# Patient Record
Sex: Female | Born: 1965 | Hispanic: No | Marital: Married | Smoking: Never smoker
Health system: Southern US, Community
[De-identification: ages and names within clinical notes are randomized; demographics above are authoritative.]

## PROBLEM LIST (undated history)

## (undated) DIAGNOSIS — I10 Essential (primary) hypertension: Secondary | ICD-10-CM

## (undated) DIAGNOSIS — E119 Type 2 diabetes mellitus without complications: Secondary | ICD-10-CM

## (undated) DIAGNOSIS — G629 Polyneuropathy, unspecified: Secondary | ICD-10-CM

## (undated) DIAGNOSIS — E789 Disorder of lipoprotein metabolism, unspecified: Secondary | ICD-10-CM

## (undated) HISTORY — PX: TOE AMPUTATION: SHX809

## (undated) HISTORY — DX: Disorder of lipoprotein metabolism, unspecified: E78.9

## (undated) HISTORY — DX: Polyneuropathy, unspecified: G62.9

## (undated) HISTORY — PX: OTHER SURGICAL HISTORY: SHX169

---

## 2014-09-25 ENCOUNTER — Emergency Department (HOSPITAL_COMMUNITY): Payer: Self-pay

## 2014-09-25 ENCOUNTER — Encounter (HOSPITAL_COMMUNITY): Payer: Self-pay | Admitting: Emergency Medicine

## 2014-09-25 ENCOUNTER — Emergency Department (HOSPITAL_COMMUNITY)
Admission: EM | Admit: 2014-09-25 | Discharge: 2014-09-25 | Disposition: A | Discharge status: Home / Self Care | Payer: Self-pay | Attending: Emergency Medicine | Admitting: Emergency Medicine

## 2014-09-25 DIAGNOSIS — M545 Low back pain, unspecified: Secondary | ICD-10-CM

## 2014-09-25 DIAGNOSIS — I10 Essential (primary) hypertension: Secondary | ICD-10-CM | POA: Insufficient documentation

## 2014-09-25 DIAGNOSIS — R1031 Right lower quadrant pain: Secondary | ICD-10-CM | POA: Insufficient documentation

## 2014-09-25 DIAGNOSIS — E119 Type 2 diabetes mellitus without complications: Secondary | ICD-10-CM | POA: Insufficient documentation

## 2014-09-25 DIAGNOSIS — Z3202 Encounter for pregnancy test, result negative: Secondary | ICD-10-CM | POA: Insufficient documentation

## 2014-09-25 HISTORY — DX: Essential (primary) hypertension: I10

## 2014-09-25 HISTORY — DX: Type 2 diabetes mellitus without complications: E11.9

## 2014-09-25 LAB — HEPATIC FUNCTION PANEL
ALBUMIN: 3.5 g/dL (ref 3.5–5.2)
ALT: 41 U/L — ABNORMAL HIGH (ref 0–35)
AST: 48 U/L — ABNORMAL HIGH (ref 0–37)
Alkaline Phosphatase: 103 U/L (ref 39–117)
Total Bilirubin: 0.3 mg/dL (ref 0.3–1.2)
Total Protein: 8 g/dL (ref 6.0–8.3)

## 2014-09-25 LAB — URINALYSIS, ROUTINE W REFLEX MICROSCOPIC
BILIRUBIN URINE: NEGATIVE
GLUCOSE, UA: 100 mg/dL — AB
Hgb urine dipstick: NEGATIVE
Ketones, ur: NEGATIVE mg/dL
Nitrite: NEGATIVE
PROTEIN: NEGATIVE mg/dL
Specific Gravity, Urine: <1.005 — ABNORMAL LOW (ref 1.005–1.030)
Urobilinogen, UA: 0.2 mg/dL (ref 0.0–1.0)
pH: 7 (ref 5.0–8.0)

## 2014-09-25 LAB — CBC WITH DIFFERENTIAL/PLATELET
BASOS PCT: 1 % (ref 0–1)
Basophils Absolute: 0.1 10*3/uL (ref 0.0–0.1)
Eosinophils Absolute: 0.2 10*3/uL (ref 0.0–0.7)
Eosinophils Relative: 2 % (ref 0–5)
HEMATOCRIT: 37.9 % (ref 36.0–46.0)
HEMOGLOBIN: 12 g/dL (ref 12.0–15.0)
LYMPHS ABS: 4.3 10*3/uL — AB (ref 0.7–4.0)
Lymphocytes Relative: 39 % (ref 12–46)
MCH: 23.3 pg — AB (ref 26.0–34.0)
MCHC: 31.7 g/dL (ref 30.0–36.0)
MCV: 73.6 fL — ABNORMAL LOW (ref 78.0–100.0)
MONO ABS: 0.7 10*3/uL (ref 0.1–1.0)
MONOS PCT: 7 % (ref 3–12)
NEUTROS PCT: 53 % (ref 43–77)
Neutro Abs: 5.8 10*3/uL (ref 1.7–7.7)
Platelets: 274 10*3/uL (ref 150–400)
RBC: 5.15 MIL/uL — AB (ref 3.87–5.11)
RDW: 15.9 % — ABNORMAL HIGH (ref 11.5–15.5)
WBC: 11.1 10*3/uL — ABNORMAL HIGH (ref 4.0–10.5)

## 2014-09-25 LAB — PREGNANCY, URINE: Preg Test, Ur: NEGATIVE

## 2014-09-25 LAB — LIPASE, BLOOD: Lipase: 35 U/L (ref 11–59)

## 2014-09-25 LAB — BASIC METABOLIC PANEL
Anion gap: 14 (ref 5–15)
BUN: 12 mg/dL (ref 6–23)
CHLORIDE: 94 meq/L — AB (ref 96–112)
CO2: 26 meq/L (ref 19–32)
CREATININE: 0.52 mg/dL (ref 0.50–1.10)
Calcium: 9.5 mg/dL (ref 8.4–10.5)
GFR calc Af Amer: >90 mL/min (ref >90)
GFR calc non Af Amer: >90 mL/min (ref >90)
Glucose, Bld: 256 mg/dL — ABNORMAL HIGH (ref 70–99)
POTASSIUM: 4.1 meq/L (ref 3.7–5.3)
Sodium: 134 mEq/L — ABNORMAL LOW (ref 137–147)

## 2014-09-25 LAB — URINE MICROSCOPIC-ADD ON

## 2014-09-25 MED ORDER — HYDROCODONE-ACETAMINOPHEN 5-325 MG PO TABS: 1.0000 | ORAL_TABLET | Freq: Four times a day (QID) | ORAL | Status: DC | PRN

## 2014-09-25 MED ORDER — IOHEXOL 300 MG/ML  SOLN
100.0000 mL | Freq: Once | INTRAMUSCULAR | Status: AC | PRN
  Administered 2014-09-25: 100 mL via INTRAVENOUS

## 2014-09-25 MED ORDER — ONDANSETRON HCL 4 MG/2ML IJ SOLN
4.0000 mg | Freq: Once | INTRAMUSCULAR | Status: AC
  Administered 2014-09-25: 4 mg via INTRAVENOUS
  Filled 2014-09-25: qty 2

## 2014-09-25 MED ORDER — HYDROMORPHONE HCL 1 MG/ML IJ SOLN
0.5000 mg | Freq: Once | INTRAMUSCULAR | Status: AC
  Administered 2014-09-25: 0.5 mg via INTRAVENOUS
  Filled 2014-09-25: qty 1

## 2014-09-25 MED ORDER — IOHEXOL 300 MG/ML  SOLN
50.0000 mL | Freq: Once | INTRAMUSCULAR | Status: AC | PRN
  Administered 2014-09-25: 50 mL via ORAL

## 2014-09-25 NOTE — ED Provider Notes (Signed)
CSN: [id number]     Arrival date & time 09/25/14  1731 History  This chart was scribe for Benny LennertJoseph L Sherrilyn Nairn, MD by Angelene GiovanniEmmanuella Mensah, ED Scribe. The patient was seen in room APA18/APA18 and the patient's care was started at 8:33 PM.   Chief Complaint  Patient presents with  . Abdominal Pain   Patient is a 48 y.o. female presenting with abdominal pain. The history is provided by the patient. No language interpreter was used.  Abdominal Pain Pain location:  LLQ and RLQ Pain severity:  Moderate Duration:  8 days Timing:  Constant Chronicity:  New Relieved by:  Nothing Worsened by:  Nothing tried Ineffective treatments:  NSAIDs Associated symptoms: no chest pain, no cough, no diarrhea, no fatigue and no hematuria    HPI Comments:  HPI Comments: Madeline Chang is a 48 y.o. female who presents to the Emergency Department complaining of moderate lower back pain and lower abdominal pain starting about 7-8 days ago. She notes that the pain in her left lower back is greater than the pain in her right lower back.She reports that she has been taking Aleve to treat her symptoms with no relief. She denies vomiting. She states that she has had these similar symptoms in the past and diagnosed with musculoskeletal pain. She reports having a history of DM and previously diagnosed with a UTI.   Past Medical History  Diagnosis Date  . Diabetes mellitus without complication   . Hypertension    Past Surgical History  Procedure Laterality Date  . Cesarean section    . Amputation 2 toes     No family history on file. History  Substance Use Topics  . Smoking status: Never Smoker   . Smokeless tobacco: Not on file  . Alcohol Use: No   OB History   Grav Para Term Preterm Abortions TAB SAB Ect Mult Living                 Review of Systems  Constitutional: Negative for appetite change and fatigue.  HENT: Negative for congestion, ear discharge and sinus pressure.   Eyes: Negative for discharge.   Respiratory: Negative for cough.   Cardiovascular: Negative for chest pain.  Gastrointestinal: Negative for abdominal pain and diarrhea.  Genitourinary: Negative for frequency and hematuria.  Musculoskeletal: Negative for back pain.  Skin: Negative for rash.  Neurological: Negative for seizures and headaches.  Psychiatric/Behavioral: Negative for hallucinations.      Allergies  Review of patient's allergies indicates no known allergies.  Home Medications   Prior to Admission medications   Not on File   BP 151/74  Pulse 90  Temp(Src) 99.8 F (37.7 C) (Oral)  Resp 16  SpO2 99%  LMP 06/10/2014 Physical Exam  Nursing note and vitals reviewed. Constitutional: She is oriented to person, place, and time. She appears well-developed.  HENT:  Head: Normocephalic.  Eyes: Conjunctivae and EOM are normal. No scleral icterus.  Neck: Neck supple. No thyromegaly present.  Cardiovascular: Normal rate and regular rhythm.  Exam reveals no gallop and no friction rub.   No murmur heard. Pulmonary/Chest: No stridor. She has no wheezes. She has no rales. She exhibits no tenderness.  Abdominal: She exhibits no distension. There is tenderness. There is no rebound.  Mild  LLQ tenderness   Musculoskeletal: Normal range of motion. She exhibits tenderness. She exhibits no edema.  Mild lumbar tenderness  Lymphadenopathy:    She has no cervical adenopathy.  Neurological: She is oriented to person, place,  and time. She exhibits normal muscle tone. Coordination normal.  Skin: No rash noted. No erythema.  Psychiatric: She has a normal mood and affect. Her behavior is normal.    ED Course  Procedures (including critical care time)   COORDINATION OF CARE: 8:34 PM- Pt advised of plan for treatment and pt agrees.    Labs Review Labs Reviewed  URINALYSIS, ROUTINE W REFLEX MICROSCOPIC - Abnormal; Notable for the following:    Specific Gravity, Urine <1.005 (*)    Glucose, UA 100 (*)     Leukocytes, UA TRACE (*)    All other components within normal limits  URINE MICROSCOPIC-ADD ON - Abnormal; Notable for the following:    Squamous Epithelial / LPF FEW (*)    All other components within normal limits  PREGNANCY, URINE  BASIC METABOLIC PANEL  CBC WITH DIFFERENTIAL    Imaging Review No results found.   EKG Interpretation None      MDM   Final diagnoses:  None    The chart was scribed for me under my direct supervision.  I personally performed the history, physical, and medical decision making and all procedures in the evaluation of this patient.Benny Lennert.    Madgie Dhaliwal L Adelaine Roppolo, MD 09/25/14 [id number]

## 2014-09-25 NOTE — ED Notes (Addendum)
Low back and abd pain for 1 week, no vomiting, no diarrhea.   Dysuria,  Taking AZO and aleve

## 2014-09-25 NOTE — Discharge Instructions (Signed)
Follow up next week for recheck °

## 2014-11-22 LAB — BASIC METABOLIC PANEL: Glucose: 287

## 2014-11-22 LAB — LIPID PANEL: Triglycerides: 150 (ref 40–160)

## 2014-11-22 LAB — CBC AND DIFFERENTIAL
Neutrophils Absolute: 7
WBC: 12.1

## 2014-11-22 LAB — HEPATIC FUNCTION PANEL: ALT: 42 — AB (ref 7–35)

## 2014-12-20 ENCOUNTER — Emergency Department (HOSPITAL_COMMUNITY)
Admission: EM | Admit: 2014-12-20 | Discharge: 2014-12-20 | Disposition: A | Discharge status: Home / Self Care | Payer: 59 | Attending: Emergency Medicine | Admitting: Emergency Medicine

## 2014-12-20 ENCOUNTER — Encounter (HOSPITAL_COMMUNITY): Payer: Self-pay

## 2014-12-20 DIAGNOSIS — Z79899 Other long term (current) drug therapy: Secondary | ICD-10-CM | POA: Diagnosis not present

## 2014-12-20 DIAGNOSIS — I10 Essential (primary) hypertension: Secondary | ICD-10-CM | POA: Diagnosis not present

## 2014-12-20 DIAGNOSIS — Z794 Long term (current) use of insulin: Secondary | ICD-10-CM | POA: Diagnosis not present

## 2014-12-20 DIAGNOSIS — Z7982 Long term (current) use of aspirin: Secondary | ICD-10-CM | POA: Diagnosis not present

## 2014-12-20 DIAGNOSIS — E1165 Type 2 diabetes mellitus with hyperglycemia: Secondary | ICD-10-CM | POA: Insufficient documentation

## 2014-12-20 DIAGNOSIS — R739 Hyperglycemia, unspecified: Secondary | ICD-10-CM

## 2014-12-20 LAB — CBC WITH DIFFERENTIAL/PLATELET
Basophils Absolute: 0 10*3/uL (ref 0.0–0.1)
Basophils Relative: 0 % (ref 0–1)
Eosinophils Absolute: 0.2 10*3/uL (ref 0.0–0.7)
Eosinophils Relative: 2 % (ref 0–5)
HCT: 37.8 % (ref 36.0–46.0)
Hemoglobin: 11.7 g/dL — ABNORMAL LOW (ref 12.0–15.0)
Lymphocytes Relative: 33 % (ref 12–46)
Lymphs Abs: 3.9 10*3/uL (ref 0.7–4.0)
MCH: 23.3 pg — ABNORMAL LOW (ref 26.0–34.0)
MCHC: 31 g/dL (ref 30.0–36.0)
MCV: 75.3 fL — ABNORMAL LOW (ref 78.0–100.0)
Monocytes Absolute: 0.7 10*3/uL (ref 0.1–1.0)
Monocytes Relative: 6 % (ref 3–12)
Neutro Abs: 7 10*3/uL (ref 1.7–7.7)
Neutrophils Relative %: 59 % (ref 43–77)
Platelets: 276 10*3/uL (ref 150–400)
RBC: 5.02 MIL/uL (ref 3.87–5.11)
RDW: 14.8 % (ref 11.5–15.5)
WBC: 11.9 10*3/uL — ABNORMAL HIGH (ref 4.0–10.5)

## 2014-12-20 LAB — BASIC METABOLIC PANEL
Anion gap: 11 (ref 5–15)
BUN: 14 mg/dL (ref 6–23)
CO2: 27 mmol/L (ref 19–32)
Calcium: 8.8 mg/dL (ref 8.4–10.5)
Chloride: 93 mEq/L — ABNORMAL LOW (ref 96–112)
Creatinine, Ser: 0.64 mg/dL (ref 0.50–1.10)
GFR calc Af Amer: >90 mL/min (ref >90)
GFR calc non Af Amer: >90 mL/min (ref >90)
Glucose, Bld: 400 mg/dL — ABNORMAL HIGH (ref 70–99)
Potassium: 4.1 mmol/L (ref 3.5–5.1)
Sodium: 131 mmol/L — ABNORMAL LOW (ref 135–145)

## 2014-12-20 LAB — CBG MONITORING, ED
Glucose-Capillary: 382 mg/dL — ABNORMAL HIGH (ref 70–99)
Glucose-Capillary: 297 mg/dL — ABNORMAL HIGH (ref 70–99)
Glucose-Capillary: 249 mg/dL — ABNORMAL HIGH (ref 70–99)

## 2014-12-20 LAB — URINE MICROSCOPIC-ADD ON

## 2014-12-20 LAB — URINALYSIS, ROUTINE W REFLEX MICROSCOPIC
Bilirubin Urine: NEGATIVE
Glucose, UA: >1000 mg/dL — AB
Hgb urine dipstick: NEGATIVE
Ketones, ur: NEGATIVE mg/dL
Leukocytes, UA: NEGATIVE
Nitrite: NEGATIVE
Protein, ur: NEGATIVE mg/dL
Specific Gravity, Urine: 1.012 (ref 1.005–1.030)
Urobilinogen, UA: 0.2 mg/dL (ref 0.0–1.0)
pH: 6.5 (ref 5.0–8.0)

## 2014-12-20 MED ORDER — INSULIN ASPART 100 UNIT/ML ~~LOC~~ SOLN
10.0000 [IU] | Freq: Once | SUBCUTANEOUS | Status: AC
  Administered 2014-12-20: 10 [IU] via INTRAVENOUS

## 2014-12-20 MED ORDER — INSULIN ASPART 100 UNIT/ML FLEXPEN: 1.0000 [IU] | PEN_INJECTOR | Freq: Three times a day (TID) | SUBCUTANEOUS | Status: DC

## 2014-12-20 MED ORDER — SODIUM CHLORIDE 0.9 % IV BOLUS (SEPSIS)
1000.0000 mL | Freq: Once | INTRAVENOUS | Status: AC
  Administered 2014-12-20: 1000 mL via INTRAVENOUS

## 2014-12-20 MED ORDER — INSULIN ASPART 100 UNIT/ML ~~LOC~~ SOLN
15.0000 [IU] | Freq: Once | SUBCUTANEOUS | Status: DC
  Filled 2014-12-20: qty 1

## 2014-12-20 NOTE — ED Notes (Signed)
She states she has had some issues with controlling her blood sugars.  She states she just obtained health insurance and is in process of becoming pt. Of [place], but needs meds until she sees him.

## 2014-12-20 NOTE — ED Provider Notes (Signed)
CSN: [id number]     Arrival date & time 12/20/14  1744 History   First MD Initiated Contact with Patient 12/20/14 1825     Chief Complaint  Patient presents with  . Hyperglycemia     (Consider location/radiation/quality/duration/timing/severity/associated sxs/prior Treatment) HPI   49 year old female with hyperglycemia. She reports that for the past 2 weeks or so she's had blood sugars have consistently ranged from 300-400. She denies any significant change in her dietary habits. Reports compliance with her medications. Activity level has not been steadily decreased. She endorses polyuria, polydipsia and some mild blurred vision. No dizziness or lightheadedness. No fever. No urinary complaints aside from the polyuria.  Past Medical History  Diagnosis Date  . Diabetes mellitus without complication   . Hypertension    Past Surgical History  Procedure Laterality Date  . Cesarean section    . Amputation 2 toes     No family history on file. History  Substance Use Topics  . Smoking status: Never Smoker   . Smokeless tobacco: Not on file  . Alcohol Use: No   OB History    No data available     Review of Systems  All systems reviewed and negative, other than as noted in HPI.   Allergies  Review of patient's allergies indicates no known allergies.  Home Medications   Prior to Admission medications   Medication Sig Start Date End Date Taking? Authorizing Provider  aspirin 81 MG tablet Take 81 mg by mouth daily.   Yes Historical Provider, MD  HYDROcodone-acetaminophen (NORCO/VICODIN) 5-325 MG per tablet Take 1 tablet by mouth every 6 (six) hours as needed for moderate pain. 09/25/14  Yes Benny Lennert, MD  Insulin NPH Isophane & Regular (HUMULIN 70/30 [place]) Inject 45-65 Units into the skin 2 (two) times daily. 70 units in the morning and 60 unts in the evening.   Yes Historical Provider, MD  losartan-hydrochlorothiazide (HYZAAR) 50-12.5 MG per tablet Take 1 tablet by mouth  daily. Brand name Co-Eziday not available in the [place]   Yes Historical Provider, MD  metFORMIN (GLUCOPHAGE) 500 MG tablet Take 1,000 mg by mouth at bedtime.    Yes Historical Provider, MD  Multiple Vitamins-Minerals (CENTRUM PO) Take 1 tablet by mouth daily.   Yes Historical Provider, MD  Saxagliptin-Metformin (KOMBIGLYZE XR) 04-999 MG TB24 Take 1,000 mg by mouth every morning.   Yes Historical Provider, MD   BP 121/57 mmHg  Pulse 85  Temp(Src) 98.1 F (36.7 C) (Oral)  Resp 20  SpO2 97%  LMP 11/03/2014 (Exact Date) Physical Exam  Constitutional: She appears well-developed and well-nourished. No distress.  Laying in bed. No acute distress. Obese.  HENT:  Head: Normocephalic and atraumatic.  Eyes: Conjunctivae are normal. Right eye exhibits no discharge. Left eye exhibits no discharge.  Neck: Neck supple.  Cardiovascular: Normal rate, regular rhythm and normal heart sounds.  Exam reveals no gallop and no friction rub.   No murmur heard. Pulmonary/Chest: Effort normal and breath sounds normal. No respiratory distress.  Abdominal: Soft. She exhibits no distension. There is no tenderness.  Musculoskeletal: She exhibits no edema or tenderness.  Neurological: She is alert.  Skin: Skin is warm and dry.  Psychiatric: She has a normal mood and affect. Her behavior is normal. Thought content normal.  Nursing note and vitals reviewed.   ED Course  Procedures (including critical care time) Labs Review Labs Reviewed  CBC WITH DIFFERENTIAL - Abnormal; Notable for the following:    WBC 11.9 (*)  Hemoglobin 11.7 (*)    MCV 75.3 (*)    MCH 23.3 (*)    All other components within normal limits  BASIC METABOLIC PANEL - Abnormal; Notable for the following:    Sodium 131 (*)    Chloride 93 (*)    Glucose, Bld 400 (*)    All other components within normal limits  CBG MONITORING, ED - Abnormal; Notable for the following:    Glucose-Capillary 382 (*)    All other components within normal  limits  URINALYSIS, ROUTINE W REFLEX MICROSCOPIC    Imaging Review No results found.   EKG Interpretation None      MDM   Final diagnoses:  Hyperglycemia    49 year old female with hyperglycemia. Currently on insulin as well as oral medications. She reports compliance. We'll additionally start her on a sliding scale. Understands the need to keep a close log of her blood sugars follow-up with PCP sooner she can. Return precautions were discussed.    Raeford RazorStephen Kazumi Lachney, MD 12/22/14 1058

## 2014-12-20 NOTE — Discharge Instructions (Signed)
BG 150-199: 1 unit Insulin  BG 200-249: 3 units Insulin BG 250-299: 5 units Insulin BG 300-349: 7 unit Insulin BG Over 350: 8 units Insulin    Hyperglycemia Hyperglycemia occurs when the glucose (sugar) in your blood is too high. Hyperglycemia can happen for many reasons, but it most often happens to people who do not know they have diabetes or are not managing their diabetes properly.  CAUSES  Whether you have diabetes or not, there are other causes of hyperglycemia. Hyperglycemia can occur when you have diabetes, but it can also occur in other situations that you might not be as aware of, such as: Diabetes  If you have diabetes and are having problems controlling your blood glucose, hyperglycemia could occur because of some of the following reasons:  Not following your meal plan.  Not taking your diabetes medications or not taking it properly.  Exercising less or doing less activity than you normally do.  Being sick. Pre-diabetes  This cannot be ignored. Before people develop Type 2 diabetes, they almost always have "pre-diabetes." This is when your blood glucose levels are higher than normal, but not yet high enough to be diagnosed as diabetes. Research has shown that some long-term damage to the body, especially the heart and circulatory system, may already be occurring during pre-diabetes. If you take action to manage your blood glucose when you have pre-diabetes, you may delay or prevent Type 2 diabetes from developing. Stress  If you have diabetes, you may be "diet" controlled or on oral medications or insulin to control your diabetes. However, you may find that your blood glucose is higher than usual in the hospital whether you have diabetes or not. This is often referred to as "stress hyperglycemia." Stress can elevate your blood glucose. This happens because of hormones put out by the body during times of stress. If stress has been the cause of your high blood glucose, it can  be followed regularly by your caregiver. That way he/she can make sure your hyperglycemia does not continue to get worse or progress to diabetes. Steroids  Steroids are medications that act on the infection fighting system (immune system) to block inflammation or infection. One side effect can be a rise in blood glucose. Most people can produce enough extra insulin to allow for this rise, but for those who cannot, steroids make blood glucose levels go even higher. It is not unusual for steroid treatments to "uncover" diabetes that is developing. It is not always possible to determine if the hyperglycemia will go away after the steroids are stopped. A special blood test called an A1c is sometimes done to determine if your blood glucose was elevated before the steroids were started. SYMPTOMS  Thirsty.  Frequent urination.  Dry mouth.  Blurred vision.  Tired or fatigue.  Weakness.  Sleepy.  Tingling in feet or leg. DIAGNOSIS  Diagnosis is made by monitoring blood glucose in one or all of the following ways:  A1c test. This is a chemical found in your blood.  Fingerstick blood glucose monitoring.  Laboratory results. TREATMENT  First, knowing the cause of the hyperglycemia is important before the hyperglycemia can be treated. Treatment may include, but is not be limited to:  Education.  Change or adjustment in medications.  Change or adjustment in meal plan.  Treatment for an illness, infection, etc.  More frequent blood glucose monitoring.  Change in exercise plan.  Decreasing or stopping steroids.  Lifestyle changes. HOME CARE INSTRUCTIONS   Test your  blood glucose as directed.  Exercise regularly. Your caregiver will give you instructions about exercise. Pre-diabetes or diabetes which comes on with stress is helped by exercising.  Eat wholesome, balanced meals. Eat often and at regular, fixed times. Your caregiver or nutritionist will give you a meal plan to guide  your sugar intake.  Being at an ideal weight is important. If needed, losing as little as 10 to 15 pounds may help improve blood glucose levels. SEEK MEDICAL CARE IF:   You have questions about medicine, activity, or diet.  You continue to have symptoms (problems such as increased thirst, urination, or weight gain). SEEK IMMEDIATE MEDICAL CARE IF:   You are vomiting or have diarrhea.  Your breath smells fruity.  You are breathing faster or slower.  You are very sleepy or incoherent.  You have numbness, tingling, or pain in your feet or hands.  You have chest pain.  Your symptoms get worse even though you have been following your caregiver's orders.  If you have any other questions or concerns. Document Released: 05/24/2001 Document Revised: 02/20/2012 Document Reviewed: 03/26/2012 [facility] Patient Information 2015 [place], [place]. This information is not intended to replace advice given to you by your health care provider. Make sure you discuss any questions you have with your health care provider.  Correction Insulin Your health care provider has decided you need to take insulin regularly. You have been given a correction scale (also called a sliding scale) in case you need extra insulin when your blood sugar is too high (hyperglycemia). The following instructions will assist you in how to use that correction scale.  WHAT IS A CORRECTION SCALE?  When you check your blood sugar, sometimes it will be higher than your health care provider has told you it should be. You may need an extra dose of insulin to bring your blood sugar to the recommended level (also known as your goal, target, or normal level). The correction scale is prescribed by your health care provider based on your specific needs.  Your correction scale has two parts:   The first shows you a blood sugar range.   The second part tells you how much extra insulin to give yourself if your blood sugar falls within  this range. You will not need an extra dose of insulin if your blood glucose is in the desired range. You should simply give yourself the normal amount of insulin that your health care provider has ordered for you.  WHY IS IT IMPORTANT TO KEEP YOUR BLOOD SUGAR LEVELS AT YOUR DESIRED LEVEL?  Keeping your blood sugar at the desired level helps to prevent long-term complications of diabetes, such as eye disease, kidney failure, nerve damage, and other serious complications. WHAT TYPE OF INSULIN WILL YOU USE?  To help bring down blood sugar levels that are too high, your health care provider will prescribe a short-acting or a rapid-acting insulin. An example of a short-acting insulin would be regular insulin. Remember, you may also have a longer-acting insulin prescribed for you.  WHAT DO YOU NEED TO DO?   Check your blood sugar with your home blood glucose meter as recommended by your health care provider.   Using your correction scale, find the range that your blood sugar lies in.   Look for the units of insulin that match that blood sugar range. Give yourself the dose of correction insulin your health care provider has prescribed. Always make sure you are using the right type of insulin.  Prior to the injection, make sure you have food available that you can eat in the next 15-30 minutes.   If your correction insulin is rapid acting, start eating your meal within 15 minutes after you have given yourself the insulin injection. If you wait longer than 15 minutes to eat, your blood sugar might get too low.   If your correction insulin is short acting(regular), start eating your meal within 30 minutes after you have given yourself the insulin injection. If you wait longer than 30 minutes to eat, your blood sugar might get too low. Symptoms of low blood sugar (hypoglycemia) may include feeling shaky or weak, sweating, feeling confused, difficulty seeing, agitation, crankiness, or numbness of  the lips or tongue. Check your blood sugar immediately and treat your results as directed by your health care provider.   Keep a log of your blood sugar results with the time you took the test and the amount of insulin that you injected. This information will help your health care provider manage your medicines.   Note on your log anything that may affect your blood sugar level, such as:   Changes in normal exercise or activity.   Changes in your normal schedule, such as staying up late, going on vacation, changing your diet, or holidays.   New medicines. This includes prescription and over-the-counter medicines. Some medicines may cause high blood sugar.   Sickness, stress, or anxiety.   Changes in the time you took your medicine.   Changes in your meals, such as skipping a meal, having a late meal, or dining out.   Eating things that may affect blood glucose, such as snacks, meal portions that are larger than normal, drinks with sugar, or eating less than usual.   Ask your health care provider any questions you have.  Be aware of "stacking" your insulin doses. This happens when you correct a high blood sugar level by giving yourself extra insulin too soon after a previous correction dose or mealtime dose. You may then have too much insulin still active in your body and may be at risk for hypoglycemia. WHY DO YOU NEED A CORRECTION SCALE IF YOU HAVE NEVER BEEN DIAGNOSED WITH DIABETES?   Keeping your blood glucose in the target range is important for your overall health.   You may have been prescribed medicines that cause your blood glucose to be higher than normal. WHEN SHOULD YOU SEEK MEDICAL CARE? Contact your health care provider if:   You have experienced hypoglycemia that you are unable to treat with your usual routine.   You have a high blood sugar level that is not coming down with the correction dose.  Your blood sugar is often too low or does not come up even  if you eat a fast-acting carbohydrate. Someone who lives with you should seek immediate medical care if you become unresponsive. Document Released: 04/21/2011 Document Revised: 07/31/2013 Document Reviewed: 05/10/2013 [facility] Patient Information 2015 [place], [place]. This information is not intended to replace advice given to you by your health care provider. Make sure you discuss any questions you have with your health care provider.

## 2017-02-09 ENCOUNTER — Encounter (HOSPITAL_BASED_OUTPATIENT_CLINIC_OR_DEPARTMENT_OTHER): Payer: 59 | Attending: Internal Medicine

## 2017-04-27 ENCOUNTER — Ambulatory Visit: Payer: 59 | Admitting: Dietician

## 2017-05-25 ENCOUNTER — Ambulatory Visit: Payer: 59 | Admitting: Dietician

## 2017-06-20 ENCOUNTER — Ambulatory Visit (INDEPENDENT_AMBULATORY_CARE_PROVIDER_SITE_OTHER): Payer: Self-pay

## 2017-06-20 ENCOUNTER — Encounter (HOSPITAL_COMMUNITY): Payer: Self-pay

## 2017-06-20 ENCOUNTER — Emergency Department
Admission: EM | Admit: 2017-06-20 | Discharge: 2017-06-20 | Disposition: A | Discharge status: Home / Self Care | Payer: BC Managed Care – PPO | Attending: Emergency Medicine | Admitting: Emergency Medicine

## 2017-06-20 ENCOUNTER — Emergency Department (EMERGENCY_DEPARTMENT_HOSPITAL): Payer: BC Managed Care – PPO

## 2017-06-20 DIAGNOSIS — Z794 Long term (current) use of insulin: Secondary | ICD-10-CM | POA: Insufficient documentation

## 2017-06-20 DIAGNOSIS — S99922A Unspecified injury of left foot, initial encounter: Secondary | ICD-10-CM | POA: Insufficient documentation

## 2017-06-20 DIAGNOSIS — Z7982 Long term (current) use of aspirin: Secondary | ICD-10-CM | POA: Insufficient documentation

## 2017-06-20 DIAGNOSIS — I10 Essential (primary) hypertension: Secondary | ICD-10-CM | POA: Insufficient documentation

## 2017-06-20 DIAGNOSIS — S90122A Contusion of left lesser toe(s) without damage to nail, initial encounter: Secondary | ICD-10-CM

## 2017-06-20 DIAGNOSIS — Z79899 Other long term (current) drug therapy: Secondary | ICD-10-CM | POA: Insufficient documentation

## 2017-06-20 DIAGNOSIS — Z89422 Acquired absence of other left toe(s): Secondary | ICD-10-CM | POA: Insufficient documentation

## 2017-06-20 DIAGNOSIS — Z791 Long term (current) use of non-steroidal anti-inflammatories (NSAID): Secondary | ICD-10-CM | POA: Insufficient documentation

## 2017-06-20 DIAGNOSIS — S91302A Unspecified open wound, left foot, initial encounter: Secondary | ICD-10-CM

## 2017-06-20 DIAGNOSIS — E1140 Type 2 diabetes mellitus with diabetic neuropathy, unspecified: Secondary | ICD-10-CM | POA: Insufficient documentation

## 2017-06-20 HISTORY — DX: Essential (primary) hypertension: I10

## 2017-06-20 HISTORY — DX: Type 2 diabetes mellitus without complications (CMS HCC): E11.9

## 2017-06-20 LAB — SEDIMENTATION RATE: ERYTHROCYTE SEDIMENTATION RATE (ESR): 26 mm/h (ref 0–30)

## 2017-06-20 LAB — BASIC METABOLIC PANEL
ANION GAP: 10 mmol/L (ref 4–13)
BUN/CREA RATIO: 18 (ref 6–22)
BUN: 14 mg/dL (ref 8–25)
CALCIUM: 9.7 mg/dL (ref 8.5–10.2)
CHLORIDE: 105 mmol/L (ref 96–111)
CHLORIDE: 105 mmol/L (ref 96–111)
CO2 TOTAL: 25 mmol/L (ref 22–32)
CREATININE: 0.77 mg/dL (ref 0.49–1.10)
ESTIMATED GFR: >59 mL/min/1.73mˆ2 (ref >59)
ESTIMATED GFR: >59 mL/min/{1.73_m2} (ref >59)
GLUCOSE: 148 mg/dL — ABNORMAL HIGH (ref 65–139)
POTASSIUM: 4.1 mmol/L (ref 3.5–5.1)
SODIUM: 140 mmol/L (ref 136–145)

## 2017-06-20 LAB — CBC WITH DIFF
BASOPHIL #: 0.08 x10ˆ3/uL (ref 0.00–0.20)
BASOPHIL %: 1 %
EOSINOPHIL #: 0.33 x10ˆ3/uL (ref 0.00–0.50)
EOSINOPHIL %: 3 %
HCT: 37.8 % (ref 33.5–45.2)
HGB: 12.2 g/dL (ref 11.2–15.2)
LYMPHOCYTE #: 3.87 x10ˆ3/uL (ref 1.00–4.80)
LYMPHOCYTE %: 34 %
MCH: 24 pg — ABNORMAL LOW (ref 27.4–33.0)
MCHC: 32.2 g/dL — ABNORMAL LOW (ref 32.5–35.8)
MCV: 74.7 fL — ABNORMAL LOW (ref 78.0–100.0)
MONOCYTE #: 0.89 x10ˆ3/uL (ref 0.30–1.00)
MONOCYTE %: 8 %
MPV: 8.5 fL (ref 7.5–11.5)
NEUTROPHIL #: 6.22 x10ˆ3/uL (ref 1.50–7.70)
NEUTROPHIL %: 55 %
NEUTROPHIL %: 55 %
PLATELETS: 254 x10ˆ3/uL (ref 140–450)
RBC: 5.06 x10ˆ6/uL — ABNORMAL HIGH (ref 3.63–4.92)
RDW: 15.9 % — ABNORMAL HIGH (ref 12.0–15.0)
WBC: 11.4 x10ˆ3/uL — ABNORMAL HIGH (ref 3.5–11.0)

## 2017-06-20 LAB — C-REACTIVE PROTEIN(CRP),INFLAMMATION: CRP INFLAMMATION: 18.5 mg/L — ABNORMAL HIGH (ref <=8.0)

## 2017-06-20 LAB — POC BLOOD GLUCOSE (RESULTS): GLUCOSE, POC: 193 mg/dL — ABNORMAL HIGH (ref 70–105)

## 2017-06-20 NOTE — ED Attending Note (Addendum)
I was physically present and directly supervised this patient's care. Patient was seen and examined. The midlevel's/resident's history and exam were reviewed. Key elements in addition to and/or correction of that documentation are as follows:    Chief Complaint   Patient presents with   . Foot Pain     pt reports she is diabetic has swollen feet and a discolored left pinky toe        Yvonne Hartman is a 51 y.o. 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     Intervention: clinically pt w no open wound to L little toe w negative sed rate, osteo felt unlikely   Impressions: .  Encounter Diagnosis   Name Primary?   Marland Kitchen. Foot injury Yes       Dispo: d/c home, f/u with her podiatrist at home    Additional verbal discharge instructions were given and discussed with the patient    Galen DaftJustine Taneka Espiritu, MD 06/20/2017, 21:05  Emergency Medicine Attending    Chart completed after conclusion of patient care due to time constraints of direct patient care during shift.

## 2017-06-20 NOTE — ED Nurses Note (Signed)
Patient to X-ray via wheelchair.

## 2017-06-20 NOTE — ED Nurses Note (Signed)
Patient presents with chief complaint of discoloration to L pinky toe. Patient reports first noticing black discoloration to toe yesterday. Patient reports chronic ulcer to bottom of L foot. Hx of L toe amputations. Denies any trauma to foot, N/V/D or fever/chills.

## 2017-06-20 NOTE — ED Nurses Note (Signed)
ED resident at bedside to update patient and family on plan of care.

## 2017-06-20 NOTE — ED Nurses Note (Signed)
ED attending at bedside to evaluate patient.

## 2017-06-20 NOTE — ED Provider Notes (Signed)
[facility]  Emergency Department  Provider Note    Hartman: Yvonne Hartman  Age and Gender: 51 y.o. female  Date of Birth: 08-07-1966  Date of Service: 06/20/2017  MRN: [id number]  PCP: Pcp Not In System  Attending: Dr. Dalene Carrow     Chief Complaint   Patient presents with   . Foot Pain     pt reports she is diabetic has swollen feet and a discolored left pinky toe        Clinical Impression:   Left foot pain  MDM/Course:  Yvonne Hartman is a 51 y.o. female who presented with left foot pain.     Given patients history, current symptoms, and exam concern for, but not limited to diabetic foot ulcer, traumatic ulcer, hematoma, peripheral neuropathy.    CRP and sed rate were within normal range, and BMP and CBC were also within normal range she does have a slight elevation in her white blood cell count 11.4 however denies any constitutional symptoms.  Glucose was 149.  With these findings I suspicion is very low that she has a systemic fashion or any septic due to the hematoma on her left pinky toe.   Left foot x-ray did not identify any significant pathology.   Significant discussion and conversation was had with patient as well as her husband with respect to continue to follow up with PCP after she makes at home back to [place].  Patient's daughter is also a MD working in [place].   I had a conversation with patient's daughter on the phone describing the patient's incident as well as the significant findings on her blood work.  Patient, patient's husband, patient's daughter, and I came to conclusions that patient would travel back home to follow up with PCP acutely.    ED Course   Yvonne Hartman's Documentation   Value Comment Time   XR FOOT LEFT (Reviewed) 07/11 0047   XR FOOT LEFT (Reviewed) 07/11 0047       Following the below history, physical exam, and studies, the patient was deemed stable and suitable for discharge. The patient was advised to return to the ED for any new or worsening  symptoms. Discharge medications, and follow-up instructions were discussed with the patient in detail, who verbalizes understanding. The patient is in agreement and is comfortable with the plan of care.       HPI:  Yvonne Hartman is a 51 y.o. Hispanic/Latino female presenting with left foot pain. Patient states that yesterday evening she noticed her pinky toe on her left foot was swollen and had a sensation of increased pressure in her left  lateral baby toe.   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 Patient denies any constitutional system fevers, chills, nausea, vomiting, diarrhea, diaphoresis.       ROS:   Constitutional: No fever, chills weight loss , night sweats or weakness   Skin: No rash, lesions, or diaphoresis  HENT: No headaches.   Eyes: No vision changes or photophobia   Cardio: No chest pain, palpitations, dyspnea on exertion or leg swelling   Respiratory: No cough, wheezing, hemoptysis or SOB  GI:  No nausea, vomiting, diarrhea, or stool changes.  GU:  No dysuria, hematuria, or increased frequency  MSK: No muscle aches, joint or back pain  Neuro: No seizures, LOC,  Positive for numbness tingling  or focal weakness on the left lower extremity  Psychiatric: No changes in mood, no depression, anxiety, SI/HI or substance abuse  All other systems reviewed and are negative.      Below pertinent information reviewed with patient:  Past Medical History:   Diagnosis Date   . Diabetes mellitus (CMS HCC)    . HTN (hypertension)        Medications Prior to Admission     Prescriptions    aspirin (ECOTRIN) 81 mg Oral Tablet, Delayed Release (E.C.)    Take 81 mg by mouth Once a day    canagliflozin (INVOKANA ORAL)    Take by mouth    insulin lispro (HUMALOG) 100 unit/mL Subcutaneous Solution    by Subcutaneous route Three times daily before meals    losartan potassium  (LOSARTAN ORAL)    Take by mouth    NAPROXEN ORAL    Take by mouth    sitagliptin phosphate (JANUVIA ORAL)    Take by mouth          No Known Allergies    Past Surgical History:   Procedure Laterality Date   . HX CESAREAN SECTION     . TOE AMPUTATION         Family Medical History     None          Social History     Social History   . Marital status: Married     Spouse Hartman: N/A   . Number of children: N/A   . Years of education: N/A     Social History Main Topics   . Smoking status: Never Smoker   . Smokeless tobacco: Never Used   . Alcohol use No   . Drug use: No   . Sexual activity: Not on file     Other Topics Concern   . Not on file     Social History Narrative   . No narrative on file         Objective:  ED Triage Vitals   Enc Vitals Group      BP (Non-Invasive) 06/20/17 1721 169/98      Heart Rate 06/20/17 1721 100      Respiratory Rate 06/20/17 1721 17      Temperature 06/20/17 1721 36.9 C (98.4 F)      Temp src --       SpO2-1 06/20/17 1721 95 %      Weight 06/20/17 1721 104.3 kg (230 lb)      Height 06/20/17 1721 1.626 Hartman (5\' 4" )      Head Cir --       Peak Flow --       Pain Score --       Pain Loc --       Pain Edu? --       Excl. in GC? --        Nursing notes and vital signs reviewed.    Constitutional: Pleasant 51 y.o. female sitting comfortably in exam bed, appears  in no acute distress, normal color, no cyanosis.   HENT:    Head: Normocephalic and atraumatic.    Mouth/Throat: Oropharynx is clear and mois   Eyes: EOMI, PERRLA, no conjunctival injections    Neck: Trachea midline.   Cardiovascular: RRR, No murmurs, rubs or gallops  Pulmonary/Chest: Breath sounds equal bilaterally, no wheezes rales or chest tenderness.    Abdominal: BS +. Abdomen soft, no tenderness, rebound or guarding.  Back: No midline  spinal tenderness,         Musculoskeletal: left pinky toe erythematous, edematous, with significant hematoma present.  No open ulcers, or skin lacerations.   Psychiatric: normal mood and affect.  Behavior is appropriate.   Neurological: Patient is alert and responsive, CN II-XII grossly intact, moving all extremities equally and fully,    Labs:   Labs Reviewed   BASIC METABOLIC PANEL - Abnormal; Notable for the following:        Result Value    GLUCOSE 148 (*)     All other components within normal limits   C-REACTIVE PROTEIN(CRP),INFLAMMATION - Abnormal; Notable for the following:     CRP INFLAMMATION 18.5 (*)     All other components within normal limits   CBC WITH DIFF - Abnormal; Notable for the following:     WBC 11.4 (*)     RBC 5.06 (*)     MCV 74.7 (*)     MCH 24.0 (*)     MCHC 32.2 (*)     RDW 15.9 (*)     All other components within normal limits   POC BLOOD GLUCOSE (RESULTS) - Abnormal; Notable for the following:     GLUCOSE, POC 193 (*)     All other components within normal limits    Narrative:     RN Notified   SEDIMENTATION RATE - Normal   CBC/DIFF    Narrative:     The following orders were created for panel order CBC/DIFF.  Procedure                               Abnormality         Status                     ---------                               -----------         ------                     CBC WITH ZOXW[[id number]]                Abnormal            Final result                 Please view results for these tests on the individual orders.       Imaging:  XR FOOT LEFT   Preliminary Result   The patient is status post amputation of the left second and third toes at   the level of the distal metatarsals. Periosteal reaction is noted along the   second and third metatarsal bones. Osseous erosions are suggested in the   fifth distal phalanx, on the lateral radiograph, which may represent   osteomyelitis. Diffuse subcutaneous soft tissue edema is noted. Advanced   degenerative changes are noted in the midfoot.             Patient seen by and discussed with attending physician, Dr.Pagenhardt.    Parts of this patients chart were completed in a retrospective fashion due to simultaneous direct  patient care activities in the Emergency Department.   This note was partially generated using MModal Fluency Direct system, and there may be some incorrect words, spellings, and punctuation that were not  noted in checking the note before saving.      Lequita Halt Josefa Half, MD 06/20/2017 22:34  PGY-1 Emergency Medicine  [facility]  Pager # - [facility]

## 2017-06-20 NOTE — ED Nurses Note (Signed)
Discharge instructions given.  Pt verbalized understanding. Denies question or concern.  Pt wheeled to lobby, accompanied by spouse.  hm

## 2017-06-20 NOTE — ED Nurses Note (Signed)
Pt transported to waiting room via wheelchair by CA.

## 2017-06-20 NOTE — ED Nurses Note (Signed)
20 G Left wrist by EDT; blood return present; flushes well. Labs drawn, collected, and sent to lab by EDT. RN at bedside.

## 2017-06-20 NOTE — ED Nurses Note (Signed)
Patient back to ED from X-ray.

## 2017-06-20 NOTE — ED Nurses Note (Signed)
Blood glucose in triage 193

## 2017-06-20 NOTE — Telephone Encounter (Signed)
[place]. Recent amputation of left foot 2nd and 3rd digits. Patient had concern for 5th digit that she noticed last evening was necrotic. Patient sent to the ED for further evaluation.  Yvonne DollyKristen Elaine [place], [place]  06/20/2017, 16:14

## 2017-06-23 ENCOUNTER — Emergency Department (HOSPITAL_COMMUNITY): Payer: BLUE CROSS/BLUE SHIELD

## 2017-06-23 ENCOUNTER — Emergency Department (HOSPITAL_COMMUNITY)
Admission: EM | Admit: 2017-06-23 | Discharge: 2017-06-24 | Disposition: A | Discharge status: Home / Self Care | Payer: BLUE CROSS/BLUE SHIELD | Attending: Emergency Medicine | Admitting: Emergency Medicine

## 2017-06-23 DIAGNOSIS — S91105A Unspecified open wound of left lesser toe(s) without damage to nail, initial encounter: Secondary | ICD-10-CM | POA: Diagnosis not present

## 2017-06-23 DIAGNOSIS — I8290 Acute embolism and thrombosis of unspecified vein: Secondary | ICD-10-CM

## 2017-06-23 DIAGNOSIS — S90425A Blister (nonthermal), left lesser toe(s), initial encounter: Secondary | ICD-10-CM | POA: Insufficient documentation

## 2017-06-23 DIAGNOSIS — Z7984 Long term (current) use of oral hypoglycemic drugs: Secondary | ICD-10-CM | POA: Insufficient documentation

## 2017-06-23 DIAGNOSIS — Y999 Unspecified external cause status: Secondary | ICD-10-CM | POA: Insufficient documentation

## 2017-06-23 DIAGNOSIS — E119 Type 2 diabetes mellitus without complications: Secondary | ICD-10-CM | POA: Insufficient documentation

## 2017-06-23 DIAGNOSIS — Y939 Activity, unspecified: Secondary | ICD-10-CM | POA: Insufficient documentation

## 2017-06-23 DIAGNOSIS — Z79899 Other long term (current) drug therapy: Secondary | ICD-10-CM | POA: Diagnosis not present

## 2017-06-23 DIAGNOSIS — L819 Disorder of pigmentation, unspecified: Secondary | ICD-10-CM

## 2017-06-23 DIAGNOSIS — Z7982 Long term (current) use of aspirin: Secondary | ICD-10-CM | POA: Diagnosis not present

## 2017-06-23 DIAGNOSIS — X58XXXA Exposure to other specified factors, initial encounter: Secondary | ICD-10-CM | POA: Diagnosis not present

## 2017-06-23 DIAGNOSIS — Y929 Unspecified place or not applicable: Secondary | ICD-10-CM | POA: Diagnosis not present

## 2017-06-23 DIAGNOSIS — I10 Essential (primary) hypertension: Secondary | ICD-10-CM | POA: Insufficient documentation

## 2017-06-23 MED ORDER — IOPAMIDOL (ISOVUE-370) INJECTION 76%
INTRAVENOUS | Status: AC
  Administered 2017-06-24: 100 mL
  Filled 2017-06-23: qty 100

## 2017-06-23 NOTE — ED Notes (Signed)
MD states okay for patient to eat/drink - provided turkey sandwich and water.  

## 2017-06-23 NOTE — ED Provider Notes (Signed)
51 year old female history of diabetes and toe amputation on left foot from previous diabetic infection. She presents today with a discolored left small toe. No injuries toe was noted. She was seen out of state and had x-rays performed report there is no evidence of osteomyelitis. She presents today here for further evaluation. There is been no pain, spreading redness, or fever. Patient family with concerns regarding vascular etiology. Care was discussed with vascular surgeon and CT angiogram of aorta and runoff bilateral lower extremities is obtained. There is no evidence of clot, patient will be referred to orthopedist for outpatient management. I saw and evaluated the patient, reviewed the resident's note and I agree with the findings and plan.   EKG Interpretation None         Margarita Grizzleay, Adriane Gabbert, MD 06/23/17 2344

## 2017-06-23 NOTE — ED Notes (Signed)
Patient transported to CT via stretcher.

## 2017-06-23 NOTE — ED Triage Notes (Signed)
Pt arrived with c/o left foot pain. Her left foot became discolored 3 days ago. Her left foot is cold, no pulse and may need a vascular consult. Hx of diabetes and two toes on her left foot has been amputated in 2011.

## 2017-06-23 NOTE — ED Provider Notes (Signed)
[facility]-EMERGENCY DEPT Provider Note   CSN: [id number] Arrival date & time: 06/23/17  1648     History   Chief Complaint No chief complaint on file.   HPI Madeline Chang is a 51 y.o. female.  The history is provided by the patient and a caregiver.  Illness  This is a new problem. The current episode started more than 2 days ago. The problem occurs constantly. The problem has been gradually improving. Pertinent negatives include no chest pain, no abdominal pain, no headaches and no shortness of breath. Associated symptoms comments: Left blue pinky toe. Nothing aggravates the symptoms. Nothing relieves the symptoms. She has tried nothing for the symptoms.    Past Medical History:  Diagnosis Date  . Diabetes mellitus without complication   . Hypertension     There are no active problems to display for this patient.   Past Surgical History:  Procedure Laterality Date  . amputation 2 toes    . CESAREAN SECTION      OB History    No data available       Home Medications    Prior to Admission medications   Medication Sig Start Date End Date Taking? Authorizing Provider  aspirin 81 MG tablet Take 81 mg by mouth daily.   Yes [provider]  atorvastatin (LIPITOR) 20 MG tablet Take 20 mg by mouth every morning. 05/09/17  Yes [provider]  HUMALOG 100 UNIT/ML injection Inject 20 Units into the skin 2 (two) times daily before a meal. MORNING AND BEDTIME 06/05/17  Yes [provider]  INVOKANA 300 MG TABS tablet Take 300 mg by mouth daily. 06/12/17  Yes [provider]  JANUMET 50-1000 MG tablet Take 1 tablet by mouth 2 (two) times daily. 05/08/17  Yes [provider]  LEVEMIR FLEXTOUCH 100 UNIT/ML Pen Inject 98 Units into the skin daily before breakfast. 06/21/17  Yes [provider]  losartan (COZAAR) 50 MG tablet Take 50 mg by mouth daily. 06/06/17  Yes [provider]  metFORMIN (GLUCOPHAGE) 1000 MG tablet Take 1,000 mg  by mouth See admin instructions. TWO TIMES A DAY IN LIEU OF JANUMET ONLY IF A SULFA DRUG HAS BEEN TEMPORARILY PRESCRIBED 06/06/17  Yes [provider]  Multiple Vitamins-Minerals (CENTRUM PO) Take 1 tablet by mouth daily.   Yes [provider]  naproxen (NAPROSYN) 500 MG tablet Take 500 mg by mouth 2 (two) times daily. 05/10/17  Yes [provider]  HYDROcodone-acetaminophen (NORCO/VICODIN) 5-325 MG per tablet Take 1 tablet by mouth every 6 (six) hours as needed for moderate pain. Patient not taking: Reported on 06/23/2017 09/25/14   Bethann BerkshireZammit, Joseph, MD  insulin aspart (NOVOLOG FLEXPEN) 100 UNIT/ML FlexPen Inject 1-8 Units into the skin 3 (three) times daily with meals. Patient not taking: Reported on 06/23/2017 12/20/14   Raeford RazorKohut, Stephen, MD    Family History No family history on file.  Social History Social History  Substance Use Topics  . Smoking status: Never Smoker  . Smokeless tobacco: Not on file  . Alcohol use No     Allergies   Patient has no known allergies.   Review of Systems Review of Systems  Constitutional: Negative for activity change.  HENT: Negative for sneezing and sore throat.   Respiratory: Negative for shortness of breath.   Cardiovascular: Negative for chest pain.  Gastrointestinal: Negative for abdominal pain.  Genitourinary: Negative for dysuria.  Musculoskeletal: Negative for back pain.       Toe pain  Skin: Negative for wound.  Allergic/Immunologic: Negative for immunocompromised state.  Neurological: Negative for syncope and headaches.  Hematological: Does not bruise/bleed easily.  Psychiatric/Behavioral: Negative for agitation and behavioral problems.     Physical Exam Updated Vital Signs BP 131/74 (BP Location: Left Arm)   Pulse 81   Temp 98.1 F (36.7 C) (Oral)   Resp 16   SpO2 98%   Physical Exam  Constitutional: She is oriented to person, place, and time. She appears well-developed and well-nourished. No  distress.  HENT:  Head: Normocephalic and atraumatic.  Eyes: Pupils are equal, round, and reactive to light. Conjunctivae and EOM are normal.  Neck: Neck supple. No tracheal deviation present.  Cardiovascular: Normal rate, regular rhythm and intact distal pulses.   No murmur heard. Pulmonary/Chest: Effort normal and breath sounds normal. No respiratory distress.  Abdominal: Soft. There is no tenderness.  Musculoskeletal: She exhibits no tenderness.  Neurological: She is alert and oriented to person, place, and time.  Skin: Skin is warm and dry. She is not diaphoretic.  Patient with blue left pinky toe. Insensate in that area which is not acute.  Psychiatric: She has a normal mood and affect.  Nursing note and vitals reviewed.    ED Treatments / Results  Labs (all labs ordered are listed, but only abnormal results are displayed) Labs Reviewed  BASIC METABOLIC PANEL  CBC    EKG  EKG Interpretation None       Radiology Dg Foot 2 Views Left  Result Date: 06/23/2017 CLINICAL DATA:  51 year old female with concern for osteomyelitis of the left fifth digit. EXAM: LEFT FOOT - 2 VIEW COMPARISON:  None. FINDINGS: Distal transmetatarsal amputation of the second and third rays. There is no acute fracture or dislocation. There are degenerative changes at the tarsometatarsal joint. There is no periosteal reaction or erosive changes to suggest osteomyelitis. The soft tissues are unremarkable. No radiopaque foreign object or soft tissue gas. IMPRESSION: 1. No acute fracture or dislocation. No radiographic evidence of osteomyelitis. MRI or a white blood cell nuclear scan may provide better evaluation if there is high clinical concern for osteomyelitis. 2. Status post prior second and third distal transmetatarsal amputation . Electronically Signed   By: Elgie Collard M.D.   On: 06/23/2017 21:31    Procedures Procedures (including critical care time)  Medications Ordered in ED Medications    iopamidol (ISOVUE-370) 76 % injection (not administered)     Initial Impression / Assessment and Plan / ED Course  I have reviewed the triage vital signs and the nursing notes.  Pertinent labs & imaging results that were available during my care of the patient were reviewed by me and considered in my medical decision making (see chart for details).     Patient presented today with concern for a left blue pinky toe. Initially noticed it on Tuesday. Patient with diabetic neuropathy with previous amputations of toes on that foot. Patient went to outside hospital in [place] and reported that Apparently x-ray and labwork were unremarkable and was told to follow up with primary care physician today. PCP today told her to come to the emergency department with concern for vascular occlusion.  On exam left pinky toe is blue with no obvious wound. 2+ DP pulses bilaterally. Spoke with vascular surgery who recommended CT abdomen and pelvis with runoff. Results of that were pending at time of hand off to the oncoming team. Patient with no tachycardia, tachypnea, desaturations, or other concerning signs for PE at  this time. Plan for now is that if imaging is negative, patient will be discharged with orthopedic follow-up that she will likely need further evaluation of that toe and possible amputation. Stable while under my care.  Patient was seen with my attending, Dr. Rosalia Hammers, who voiced agreement and oversaw the evaluation and treatment of this patient.   Dragon Medical illustrator was used in the creation of this note. If there are any errors or inconsistencies needing clarification, please contact me directly.   Final Clinical Impressions(s) / ED Diagnoses   Final diagnoses:  Discoloration of skin of toe    New Prescriptions New Prescriptions   No medications on file     Orson Slick, MD 06/24/17 0041    Margarita Grizzle, MD 06/24/17 [phone]

## 2017-06-24 ENCOUNTER — Encounter (HOSPITAL_COMMUNITY): Payer: Self-pay

## 2017-06-24 ENCOUNTER — Emergency Department (HOSPITAL_COMMUNITY): Payer: BLUE CROSS/BLUE SHIELD

## 2017-06-24 ENCOUNTER — Telehealth (HOSPITAL_COMMUNITY): Payer: Self-pay

## 2017-06-24 LAB — CBC
HEMATOCRIT: 38.8 % (ref 36.0–46.0)
Hemoglobin: 12 g/dL (ref 12.0–15.0)
MCH: 23.8 pg — AB (ref 26.0–34.0)
MCHC: 30.9 g/dL (ref 30.0–36.0)
MCV: 76.8 fL — AB (ref 78.0–100.0)
Platelets: 267 10*3/uL (ref 150–400)
RBC: 5.05 MIL/uL (ref 3.87–5.11)
RDW: 15.9 % — AB (ref 11.5–15.5)
WBC: 10.5 10*3/uL (ref 4.0–10.5)

## 2017-06-24 LAB — BASIC METABOLIC PANEL
Anion gap: 12 (ref 5–15)
BUN: 13 mg/dL (ref 6–20)
CHLORIDE: 98 mmol/L — AB (ref 101–111)
CO2: 26 mmol/L (ref 22–32)
Calcium: 9.1 mg/dL (ref 8.9–10.3)
Creatinine, Ser: 0.68 mg/dL (ref 0.44–1.00)
GFR calc Af Amer: >60 mL/min (ref >60)
GFR calc non Af Amer: >60 mL/min (ref >60)
GLUCOSE: 186 mg/dL — AB (ref 65–99)
POTASSIUM: 4 mmol/L (ref 3.5–5.1)
Sodium: 136 mmol/L (ref 135–145)

## 2017-06-24 LAB — CBG MONITORING, ED: Glucose-Capillary: 172 mg/dL — ABNORMAL HIGH (ref 65–99)

## 2017-06-24 NOTE — ED Notes (Signed)
Patient returned from CT. Radiology tech notified this RN that patient bent her arm up as the contrast had just gone in and the IV was being flushed with saline, which blew the IV and caused swelling. Radiologist notified and looked at site. Recommendation is to alternate ice and heat and elevate. Pt has ice pack on site at this time. Denies pain, just reports that it is uncomfortable.

## 2017-06-24 NOTE — ED Provider Notes (Signed)
Discoloration of left toe appears to be a blood blister. Distal aspect of toe opened and drainage of old blood performed. Underlying tissue is pink. Normal pulsed in left foot. Angio with runoff without abnormality. Orthopedic and pcp followup   IMPRESSION: VASCULAR Minimal aortic atherosclerosis, no embolic source for showering emboli. Three-vessel runoff to the ankle bilaterally, calf evaluation limited by phase contrast. NON-VASCULAR 1. No acute chest finding.  Coronary artery calcifications. 2. Hepatomegaly and hepatic steatosis. 3. Moderate colonic stool burden with fecalization of small bowel contents, suggesting constipation and slow transit. 4. IV contrast extravasation as described. Electronically Signed   By: Rubye OaksMelanie  Ehinger M.D.   On: 06/24/2017 02:02      Azalia Bilisampos, Agapita Savarino, MD 06/24/17 [phone]

## 2017-06-24 NOTE — ED Notes (Signed)
Patient transported to CT 

## 2017-06-24 NOTE — Progress Notes (Signed)
Patient had 40-7260ml NS extravasation to RT AC 20G. Dr. Manus GunningEhinger assessed and gave verbal intructions to patient at CT bedside. IV removed, Ice applied, arm elevated. Verbal handoff given to Geraldo PitterBrooke H RN. Extravasation orders placed.

## 2017-06-26 ENCOUNTER — Ambulatory Visit (INDEPENDENT_AMBULATORY_CARE_PROVIDER_SITE_OTHER): Payer: BLUE CROSS/BLUE SHIELD | Admitting: Orthopaedic Surgery

## 2017-06-26 DIAGNOSIS — S90425A Blister (nonthermal), left lesser toe(s), initial encounter: Secondary | ICD-10-CM

## 2017-06-26 NOTE — Progress Notes (Signed)
Swelling decreased some per patient, bruising on arm. No pain. Patient has concerns. CT technologist paged Barnetta ChapelKelly Osborne PA who will follow up with patient.

## 2017-06-26 NOTE — Progress Notes (Signed)
Office Visit Note   Patient: Madeline Chang           Date of Birth: [birth year]           MRN: [id number] Visit Date: 06/26/2017              Requested by: Knox RoyaltyJones, Enrico, MD [address] [place], [place] [address] PCP: Knox RoyaltyJones, Enrico, MD   Assessment & Plan: Visit Diagnoses:  1. Blister of toe of left foot, initial encounter     Plan: She understands a good glucose control is important. She understands aggressive foot care and inspect her feet daily. I reassured said this blister that I disapproved had normal skin underneath it and there was no drainage. There is no evidence for this with anabolic to this point. Also she is not needing local wound care. If anything worsen she'll let us know which show us follow-up as needed.  Follow-Up Instructions: Return if symptoms worsen or fail to improve.   Orders:  No orders of the defined types were placed in this encounter.  No orders of the defined types were placed in this encounter.     Procedures: No procedures performed   Clinical Data: No additional findings.   Subjective: Chief Complaint  Patient presents with  . Left Foot - Injury  The patient is someone who is referred from the emergency room after going there with a blister on her left foot. She is a diabetic and has peripheral neuropathy. She has a history of second and third ray resections in [place] secondary to diabetes. After going on a long trip and doing a lot of walking she presents in the emergency room with her fifth toe having a blister. There is concern that she may have an embolic event going on and vascular surgery was called and the recommended a CT angiogram. She was then sent to orthopedics to evaluate a blister on her toe. She reports numbness in her feet. She denies any other illnesses and says she is doing okay otherwise.  HPI  Review of Systems She currently denies any headache, chest pain, sore breath, fever, chills, nausea,  vomiting.  Objective: Vital Signs: There were no vitals taken for this visit.  Physical Exam She is alert and oriented 3 and in no acute distress Ortho Exam Examination of her left foot shows previous surgery from a wedge resection in the midfoot. Her left fifth toe has dry skin at the end of the toe consistent with an old blister. I was able to unroof this easily and found normal skin underneath this with no wounds no drainage. There is no exposed bone. Specialty Comments:  No specialty comments available.  Imaging: No results found.  X-rays on the canopy system are) I reviewed of her left foot and show previous foot surgery no acute findings. PMFS History: There are no active problems to display for this patient.  Past Medical History:  Diagnosis Date  . Diabetes mellitus without complication (HCC)   . Hypertension     No family history on file.  Past Surgical History:  Procedure Laterality Date  . amputation 2 toes    . CESAREAN SECTION     Social History   Occupational History  . Not on file.   Social History Main Topics  . Smoking status: Never Smoker  . Smokeless tobacco: Not on file  . Alcohol use No  . Drug use: No  . Sexual activity: Not on file

## 2017-07-10 ENCOUNTER — Encounter (HOSPITAL_BASED_OUTPATIENT_CLINIC_OR_DEPARTMENT_OTHER): Payer: 59

## 2017-12-04 ENCOUNTER — Encounter (HOSPITAL_COMMUNITY): Payer: Self-pay | Admitting: Emergency Medicine

## 2017-12-04 ENCOUNTER — Ambulatory Visit (HOSPITAL_COMMUNITY)
Admission: EM | Admit: 2017-12-04 | Discharge: 2017-12-04 | Disposition: A | Discharge status: Home / Self Care | Payer: BLUE CROSS/BLUE SHIELD | Attending: Internal Medicine | Admitting: Internal Medicine

## 2017-12-04 DIAGNOSIS — Z79899 Other long term (current) drug therapy: Secondary | ICD-10-CM | POA: Insufficient documentation

## 2017-12-04 DIAGNOSIS — E119 Type 2 diabetes mellitus without complications: Secondary | ICD-10-CM | POA: Diagnosis not present

## 2017-12-04 DIAGNOSIS — Z794 Long term (current) use of insulin: Secondary | ICD-10-CM | POA: Diagnosis not present

## 2017-12-04 DIAGNOSIS — R42 Dizziness and giddiness: Secondary | ICD-10-CM | POA: Diagnosis not present

## 2017-12-04 DIAGNOSIS — I10 Essential (primary) hypertension: Secondary | ICD-10-CM | POA: Diagnosis not present

## 2017-12-04 DIAGNOSIS — Z7982 Long term (current) use of aspirin: Secondary | ICD-10-CM | POA: Insufficient documentation

## 2017-12-04 LAB — POCT URINALYSIS DIP (DEVICE)
Bilirubin Urine: NEGATIVE
GLUCOSE, UA: 500 mg/dL — AB
Hgb urine dipstick: NEGATIVE
KETONES UR: NEGATIVE mg/dL
Nitrite: NEGATIVE
PH: 6 (ref 5.0–8.0)
PROTEIN: NEGATIVE mg/dL
SPECIFIC GRAVITY, URINE: 1.01 (ref 1.005–1.030)
Urobilinogen, UA: 0.2 mg/dL (ref 0.0–1.0)

## 2017-12-04 LAB — POCT I-STAT, CHEM 8
BUN: 16 mg/dL (ref 6–20)
CHLORIDE: 99 mmol/L — AB (ref 101–111)
CREATININE: 0.6 mg/dL (ref 0.44–1.00)
Calcium, Ion: 1.18 mmol/L (ref 1.15–1.40)
GLUCOSE: 187 mg/dL — AB (ref 65–99)
HCT: 42 % (ref 36.0–46.0)
Hemoglobin: 14.3 g/dL (ref 12.0–15.0)
POTASSIUM: 4.1 mmol/L (ref 3.5–5.1)
Sodium: 139 mmol/L (ref 135–145)
TCO2: 26 mmol/L (ref 22–32)

## 2017-12-04 NOTE — ED Notes (Signed)
Notified natalie burky, np of orthostatics

## 2017-12-04 NOTE — ED Provider Notes (Signed)
[facility]    CSN: [id number] Arrival date & time: 12/04/17  1605     History   Chief Complaint Chief Complaint  Patient presents with  . Dizziness    HPI Madeline Chang is a 51 y.o. female.   Madeline Chang presents with complaints of dizziness which has been ongoing for the past two days. It is worse when she lays down or transitions from laying to standing. Once standing for a few minutes the symptoms improve. Without head injury, previous similar illness, URI symptoms, CP, shortness of breath, nausea vomiting or diarrhea. Has had a decreased appetite. Has had elevated blood sugars, today's fasting was improved from the past few days at 206. She has frequent urination. Was treated for UTI approximately 3 weeks ago and feels symptoms had resolved. Has not taken any medications for her symptoms. Has been drinking water. Denies alcohol intake, does drink caffeine in the form of coffee or tea daily. Has not fallen and is ambulatory with dizziness. She is insulin dependent with diabetes, as well as history of htn.     ROS per HPI.       Past Medical History:  Diagnosis Date  . Diabetes mellitus without complication (HCC)   . Hypertension     There are no active problems to display for this patient.   Past Surgical History:  Procedure Laterality Date  . amputation 2 toes    . CESAREAN SECTION      OB History    No data available       Home Medications    Prior to Admission medications   Medication Sig Start Date End Date Taking? Authorizing Provider  aspirin 81 MG tablet Take 81 mg by mouth daily.   Yes [provider]  atorvastatin (LIPITOR) 20 MG tablet Take 20 mg by mouth every morning. 05/09/17  Yes [provider]  HUMALOG 100 UNIT/ML injection Inject 20 Units into the skin 2 (two) times daily before a meal. MORNING AND BEDTIME 06/05/17  Yes [provider]  INVOKANA 300 MG TABS tablet Take 300 mg by mouth daily. 06/12/17  Yes  [provider]  JANUMET 50-1000 MG tablet Take 1 tablet by mouth 2 (two) times daily. 05/08/17  Yes [provider]  LEVEMIR FLEXTOUCH 100 UNIT/ML Pen Inject 98 Units into the skin daily before breakfast. 06/21/17  Yes [provider]  losartan (COZAAR) 50 MG tablet Take 50 mg by mouth daily. 06/06/17  Yes [provider]  metFORMIN (GLUCOPHAGE) 1000 MG tablet Take 1,000 mg by mouth See admin instructions. TWO TIMES A DAY IN LIEU OF JANUMET ONLY IF A SULFA DRUG HAS BEEN TEMPORARILY PRESCRIBED 06/06/17  Yes [provider]  Multiple Vitamins-Minerals (CENTRUM PO) Take 1 tablet by mouth daily.   Yes [provider]  naproxen (NAPROSYN) 500 MG tablet Take 500 mg by mouth 2 (two) times daily. 05/10/17  Yes [provider]  HYDROcodone-acetaminophen (NORCO/VICODIN) 5-325 MG per tablet Take 1 tablet by mouth every 6 (six) hours as needed for moderate pain. Patient not taking: Reported on 06/23/2017 09/25/14   Bethann BerkshireZammit, Joseph, MD  insulin aspart (NOVOLOG FLEXPEN) 100 UNIT/ML FlexPen Inject 1-8 Units into the skin 3 (three) times daily with meals. Patient not taking: Reported on 06/23/2017 12/20/14   Raeford RazorKohut, Stephen, MD    Family History History reviewed. No pertinent family history.  Social History Social History   Tobacco Use  . Smoking status: Never Smoker  . Smokeless tobacco: Never Used  Substance Use Topics  . Alcohol use: No  . Drug use: No     Allergies   Patient has no known allergies.   Review of Systems Review of Systems   Physical Exam Triage Vital Signs ED Triage Vitals [12/04/17 1627]  Enc Vitals Group     BP 137/61     Pulse Rate 80     Resp 16     Temp 98.8 F (37.1 C)     Temp Source Oral     SpO2 97 %     Weight      Height      Head Circumference      Peak Flow      Pain Score      Pain Loc      Pain Edu?      Excl. in GC?    Orthostatic VS for the past 24 hrs:  BP- Lying Pulse- Lying BP- Sitting  Pulse- Sitting BP- Standing at 0 minutes Pulse- Standing at 0 minutes  12/04/17 1736 112/47 86 127/67 83 137/69 84    Updated Vital Signs BP 137/61 (BP Location: Left Arm)   Pulse 80   Temp 98.8 F (37.1 C) (Oral)   Resp 16   SpO2 97%   Visual Acuity Right Eye Distance:   Left Eye Distance:   Bilateral Distance:    Right Eye Near:   Left Eye Near:    Bilateral Near:     Physical Exam  Constitutional: She is oriented to person, place, and time. She appears well-developed and well-nourished. No distress.  HENT:  Head: Normocephalic and atraumatic.  Right Ear: Tympanic membrane, external ear and ear canal normal.  Left Ear: Tympanic membrane, external ear and ear canal normal.  Nose: Nose normal.  Mouth/Throat: Uvula is midline, oropharynx is clear and moist and mucous membranes are normal. No tonsillar exudate.  Eyes: Conjunctivae and EOM are normal. Pupils are equal, round, and reactive to light.  Cardiovascular: Normal rate, regular rhythm and normal heart sounds.  Pulmonary/Chest: Effort normal and breath sounds normal.  Neurological: She is alert and oriented to person, place, and time. She has normal strength. No cranial nerve deficit or sensory deficit. She exhibits normal muscle tone. She displays a negative Romberg sign. Coordination normal. GCS eye subscore is 4. GCS verbal subscore is 5. GCS motor subscore is 6.  Skin: Skin is warm and dry.   EKG nsr rate of 80 without factors attributing to dizziness, or acute changes.   UC Treatments / Results  Labs (all labs ordered are listed, but only abnormal results are displayed) Labs Reviewed  POCT I-STAT, CHEM 8 - Abnormal; Notable for the following components:      Result Value   Chloride 99 (*)    Glucose, Bld 187 (*)    All other components within normal limits  POCT URINALYSIS DIP (DEVICE) - Abnormal; Notable for the following components:   Glucose, UA 500 (*)    Leukocytes, UA SMALL (*)    All other components  within normal limits  URINE CULTURE    EKG  EKG Interpretation None       Radiology No results found.  Procedures Procedures (including critical care time)  Medications Ordered in UC Medications - No data to display   Initial Impression / Assessment and Plan / UC Course  I have reviewed the triage vital signs and the nursing notes.  Pertinent labs & imaging results that were available during my care of the  patient were reviewed by me and considered in my medical decision making (see chart for details).     Urine sent for culture. Without acute findings on exam. Elevated blood sugar and frequent urination. Concern for mild dehydration related to this, encouraged pushing of water intake. Negative orthostatic vitals. May try meclizine as needed. Return precautions provided. If symptoms worsen or do not improve in the next week to return to be seen or to follow up with PCP.  Patient and family member verbalized understanding and agreeable to plan.    Final Clinical Impressions(s) / UC Diagnoses   Final diagnoses:  Dizziness    ED Discharge Orders    None       Controlled Substance Prescriptions [place] Controlled Substance Registry consulted? Not Applicable   Georgetta Haber, NP 12/04/17 1806

## 2017-12-04 NOTE — Discharge Instructions (Signed)
It seems likely that you are experiencing this dizziness related to decreased fluid volume related to elevated blood sugars.  Please increase the amount of water you are drinking. You may try 12.5-25mg  of Meclizine, which is over the counter, as needed for dizziness. May cause drowsiness. If worsening dizziness, develop pain, fevers, weakness, nausea or vomiting please visit er or return to be seen. If persistent symptoms or if blood sugars remain elevated for you please recheck with your primary care provider as your insulin may need to be adjusted.  I will send you urine for culture.

## 2017-12-04 NOTE — ED Triage Notes (Signed)
PT C/O: dizziness that increases when lying down and when she gets to a standing position... Sx will last for 2 min  ONSET: 2 days  DENIES: fevers, cold sx  TAKING MEDS: none   A&O x4... NAD... Ambulatory

## 2017-12-05 LAB — URINE CULTURE: Culture: <10000 — AB

## 2017-12-13 ENCOUNTER — Ambulatory Visit: Payer: BLUE CROSS/BLUE SHIELD | Admitting: Neurology

## 2017-12-13 ENCOUNTER — Encounter: Payer: Self-pay | Admitting: Neurology

## 2017-12-13 VITALS — BP 155/87 | HR 93 | Ht 63.5 in | Wt 244.0 lb

## 2017-12-13 DIAGNOSIS — R51 Headache: Secondary | ICD-10-CM | POA: Diagnosis not present

## 2017-12-13 DIAGNOSIS — R351 Nocturia: Secondary | ICD-10-CM

## 2017-12-13 DIAGNOSIS — R519 Headache, unspecified: Secondary | ICD-10-CM

## 2017-12-13 DIAGNOSIS — R0683 Snoring: Secondary | ICD-10-CM

## 2017-12-13 DIAGNOSIS — Z6841 Body Mass Index (BMI) 40.0 and over, adult: Secondary | ICD-10-CM | POA: Diagnosis not present

## 2017-12-13 DIAGNOSIS — R0681 Apnea, not elsewhere classified: Secondary | ICD-10-CM

## 2017-12-13 NOTE — Progress Notes (Signed)
Subjective:    Patient ID: Madeline Chang is a 52 y.o. female.  HPI     Madeline FoleySaima Evadean Sproule, MD, PhD [facility] [address], [address] [address] [place], [place] [address]  Dear Dr. Yetta BarreJones,   I saw your patient, Madeline Chang, upon your kind request in my neurologic clinic today for initial consultation of her sleep disorder, in particular, concern for underlying obstructive sleep apnea. The patient is unaccompanied today. As you know, Madeline Chang is a 52 year old right-handed woman with an underlying medical history of diabetes, hypertension, status post toe amputations, and morbid obesity with a BMI of over 40, who reports snoring, witnessed apneas and excessive daytime somnolence. She has woken up with a sense of gasping for air. She says that she was supposed to have sleep evaluation a couple of years ago even. I reviewed your office note from 10/25/2017, which you kindly included. She presented to urgent care on 12/04/2017 with dizziness. She was found to be mildly dehydrated. She was advised to try meclizine over-the-counter. She states that she tried something else over-the-counter, sounds like an over-the-counter sleep aid. She took 1 pill and since she felt better the next day she did not continue with any over-the-counter medication. She goes to bed around midnight or so. It takes her maybe an hour to fall asleep. Wake up time in the morning is typically 10 AM to 10:30 AM. She does have nocturia about 3-4 times per average night. She has had occasional morning headaches. She is not aware of any family history of OSA. She denies telltale symptoms of restless legs but has had low back pain and also leg pain on the left more than right. She drinks caffeine in the form of coffee, usually 2 cups in the morning and 1 cup of tea per day, rare sodas. She lives at home with her husband and 2 of her daughters. Her oldest daughter is just finished with med school and has applied for residency  programs. She has 1 son and 3 daughters. [place] from [place] in [place] and [place] before. She does not work. She takes care of her 52-year-old granddaughter during the day. Her Epworth sleepiness score is 2 out of 24, fatigue score is 29 out of 63.  Her Past Medical History Is Significant For: Past Medical History:  Diagnosis Date  . Diabetes mellitus without complication (HCC)   . Hypertension   . Lipid disorder   . Peripheral neuropathy     Her Past Surgical History Is Significant For: Past Surgical History:  Procedure Laterality Date  . amputation 2 toes    . CESAREAN SECTION      Her Family History Is Significant For: Family History  Problem Relation Age of Onset  . Diabetes Mother   . Diabetes Father   . Diabetes Sister     Her Social History Is Significant For: Social History   Socioeconomic History  . Marital status: Married    Spouse name: None  . Number of children: None  . Years of education: None  . Highest education level: None  Social Needs  . Financial resource strain: None  . Food insecurity - worry: None  . Food insecurity - inability: None  . Transportation needs - medical: None  . Transportation needs - non-medical: None  Occupational History  . None  Tobacco Use  . Smoking status: Never Smoker  . Smokeless tobacco: Never Used  Substance and Sexual  Activity  . Alcohol use: No  . Drug use: No  . Sexual activity: None  Other Topics Concern  . None  Social History Narrative  . None    Her Allergies Are:  No Known Allergies:   Her Current Medications Are:  Outpatient Encounter Medications as of 12/13/2017  Medication Sig  . aspirin 81 MG tablet Take 81 mg by mouth daily.  Marland Kitchen atorvastatin (LIPITOR) 20 MG tablet Take 20 mg by mouth every morning.  . gabapentin (NEURONTIN) 100 MG capsule Take 100 mg by mouth 3 (three) times daily.  Marland Kitchen HUMALOG 100 UNIT/ML injection Inject 28 Units into the skin 2 (two) times daily  before a meal. MORNING AND BEDTIME  . HYDROcodone-acetaminophen (NORCO/VICODIN) 5-325 MG per tablet Take 1 tablet by mouth every 6 (six) hours as needed for moderate pain.  Marland Kitchen insulin aspart (NOVOLOG FLEXPEN) 100 UNIT/ML FlexPen Inject 1-8 Units into the skin 3 (three) times daily with meals.  . INVOKANA 300 MG TABS tablet Take 300 mg by mouth daily.  Marland Kitchen JANUMET 50-1000 MG tablet Take 1 tablet by mouth 2 (two) times daily.  Marland Kitchen LEVEMIR FLEXTOUCH 100 UNIT/ML Pen Inject 98 Units into the skin daily before breakfast.  . losartan (COZAAR) 50 MG tablet Take 50 mg by mouth daily.  . metFORMIN (GLUCOPHAGE) 1000 MG tablet Take 1,000 mg by mouth See admin instructions. TWO TIMES A DAY IN LIEU OF JANUMET ONLY IF A SULFA DRUG HAS BEEN TEMPORARILY PRESCRIBED  . Multiple Vitamins-Minerals (CENTRUM PO) Take 1 tablet by mouth daily.  . naproxen (NAPROSYN) 500 MG tablet Take 500 mg by mouth 2 (two) times daily.   No facility-administered encounter medications on file as of 12/13/2017.   :  Review of Systems:  Out of a complete 14 point review of systems, all are reviewed and negative with the exception of these symptoms as listed below: Review of Systems  Neurological:       Pt presents today to discuss her sleep. Pt has never had a sleep study but does endorse snoring.  Epworth Sleepiness Scale 0= would never doze 1= slight chance of dozing 2= moderate chance of dozing 3= high chance of dozing  Sitting and reading: 0 Watching TV: 0 Sitting inactive in a public place (ex. Theater or meeting): 0 As a passenger in a car for an hour without a break: 0 Lying down to rest in the afternoon: 1 Sitting and talking to someone: 0 Sitting quietly after lunch (no alcohol): 0 In a car, while stopped in traffic: 1 Total: 2     Objective:  Neurological Exam  Physical Exam Physical Examination:   Vitals:   12/13/17 1544  BP: (!) 155/87  Pulse: 93   General Examination: The patient is a very pleasant 52  y.o. female in no acute distress. She appears well-developed and well-nourished and well groomed. Denies vertiginous symptoms.  HEENT: Normocephalic, atraumatic, pupils are equal, round and reactive to light and accommodation. Extraocular tracking is good without limitation to gaze excursion or nystagmus noted. Normal smooth pursuit is noted. Hearing is grossly intact. Face is symmetric with normal facial animation and normal facial sensation. Speech is clear with no dysarthria noted. There is no hypophonia. There is no lip, neck/head, jaw or voice tremor. Neck is supple with full range of passive and active motion. There are no carotid bruits on auscultation. Oropharynx exam reveals: mild mouth dryness, adequate dental hygiene and moderate airway crowding, due to smaller airway entry and larger uvula, tonsils  are small. Mallampati is class II. Tongue protrudes centrally and palate elevates symmetrically. Neck size is 19 3/8 inches. She has a Mild overbite.   Chest: Clear to auscultation without wheezing, rhonchi or crackles noted.  Heart: S1+S2+0, regular and normal without murmurs, rubs or gallops noted.   Abdomen: Soft, non-tender and non-distended with normal bowel sounds appreciated on auscultation.  Extremities: There is trace pitting edema in the distal lower extremities bilaterally. Pedal pulses are intact.  Skin: Warm and dry without trophic changes noted.  Musculoskeletal: exam reveals no obvious joint deformities, tenderness or joint swelling or erythema, s/p toe amputations 2nd and 3rd toes on the L.   Neurologically:  Mental status: The patient is awake, alert and oriented in all 4 spheres. Her immediate and remote memory, attention, language skills and fund of knowledge are appropriate. There is no evidence of aphasia, agnosia, apraxia or anomia. Speech is clear with normal prosody and enunciation. Thought process is linear. Mood is normal and affect is normal.  Cranial nerves II -  XII are as described above under HEENT exam. In addition: shoulder shrug is normal with equal shoulder height noted. Motor exam: Normal bulk, strength and tone is noted. There is no drift, tremor or rebound. Romberg is negative. Reflexes are 1+ in the upper extremities and trace in the lower extremities. Fine motor skills are grossly intact.  Cerebellar testing: No dysmetria or intention tremor on finger to nose testing. Heel to shin is  difficult for her. There is no truncal or gait ataxia.  Sensory exam: intact to light touch in the upper and lower extremities.  Gait, station and balance: She stands with mild difficulty. No veering to one side is noted. No leaning to one side is noted. Posture is age-appropriate and stance is narrow based. Gait shows normal stride length and normal pace. No problems turning are noted. Tandem walk is mildly difficult for her.   Assessment and Plan:   In summary, Jazzelle Meinhardt is a very pleasant 52 y.o.-year old female with an underlying medical history of diabetes, hypertension, status post toe amputations, and morbid obesity with a BMI of over 40, whose history and physical exam are concerning for obstructive sleep apnea (OSA). I had a long chat with the patient about my findings and the diagnosis of OSA, its prognosis and treatment options. We talked about medical treatments, surgical interventions and non-pharmacological approaches. I explained in particular the risks and ramifications of untreated moderate to severe OSA, especially with respect to developing cardiovascular disease down the Road, including congestive heart failure, difficult to treat hypertension, cardiac arrhythmias, or stroke. Even type 2 diabetes has, in part, been linked to untreated OSA. Symptoms of untreated OSA include daytime sleepiness, memory problems, mood irritability and mood disorder such as depression and anxiety, lack of energy, as well as recurrent headaches, especially morning headaches.  We talked about trying to maintain a healthy lifestyle in general, as well as the importance of weight control. I encouraged the patient to eat healthy, exercise daily and keep well hydrated, to keep a scheduled bedtime and wake time routine, to not skip any meals and eat healthy snacks in between meals. I advised the patient not to drive when feeling sleepy. I recommended the following at this time: sleep study with potential positive airway pressure titration. (We will score hypopneas at 4%).   I explained the sleep test procedure to the patient and also outlined possible surgical and non-surgical treatment options of OSA, including the use of  a custom-made dental device (which would require a referral to a specialist dentist or oral surgeon), upper airway surgical options, such as pillar implants, radiofrequency surgery, tongue base surgery, and UPPP (which would involve a referral to an ENT surgeon). Rarely, jaw surgery such as mandibular advancement may be considered.  I also explained the CPAP treatment option to the patient, who indicated that she would be willing to try CPAP if the need arises. I explained the importance of being compliant with PAP treatment, not only for insurance purposes but primarily to improve Her symptoms, and for the patient's long term health benefit, including to reduce Her cardiovascular risks. I answered all her questions today and the patient was in agreement. I would like to see her back after the sleep study is completed and encouraged her to call with any interim questions, concerns, problems or updates.   Thank you very much for allowing me to participate in the care of this nice patient. If I can be of any further assistance to you please do not hesitate to call me at [phone].  Sincerely,   Madeline Foley, MD, PhD

## 2017-12-13 NOTE — Patient Instructions (Addendum)

## 2017-12-25 ENCOUNTER — Ambulatory Visit (INDEPENDENT_AMBULATORY_CARE_PROVIDER_SITE_OTHER): Payer: BLUE CROSS/BLUE SHIELD | Admitting: Neurology

## 2017-12-25 DIAGNOSIS — R0683 Snoring: Secondary | ICD-10-CM

## 2017-12-25 DIAGNOSIS — R519 Headache, unspecified: Secondary | ICD-10-CM

## 2017-12-25 DIAGNOSIS — G4733 Obstructive sleep apnea (adult) (pediatric): Secondary | ICD-10-CM | POA: Diagnosis not present

## 2017-12-25 DIAGNOSIS — R51 Headache: Secondary | ICD-10-CM

## 2017-12-25 DIAGNOSIS — R351 Nocturia: Secondary | ICD-10-CM

## 2017-12-25 DIAGNOSIS — Z6841 Body Mass Index (BMI) 40.0 and over, adult: Secondary | ICD-10-CM

## 2017-12-25 DIAGNOSIS — R0681 Apnea, not elsewhere classified: Secondary | ICD-10-CM

## 2017-12-27 ENCOUNTER — Other Ambulatory Visit: Payer: Self-pay | Admitting: Neurology

## 2017-12-27 ENCOUNTER — Telehealth: Payer: Self-pay

## 2017-12-27 DIAGNOSIS — G4733 Obstructive sleep apnea (adult) (pediatric): Secondary | ICD-10-CM

## 2017-12-27 NOTE — Telephone Encounter (Signed)
-----   Message from Huston FoleySaima Athar, MD sent at 12/27/2017  8:07 AM EST ----- Patient referred by Dr. Yetta BarreJones, seen by me on 12/13/17, split night sleep study on 12/25/17. Please call and notify patient that the recent sleep study confirmed the diagnosis of severe OSA. She did well with CPAP during the study with significant improvement of the respiratory events. Therefore, I would like start the patient on CPAP therapy at home by prescribing a machine for home use. I placed the order in the chart.  Please advise patient that we need a follow up appointment with either myself or one of our nurse practitioners in about 10 weeks post set-up to check for how the patient is feeling and how well the patient is using the machine, etc. Please go ahead and schedule the appointment, while you have the patient on the phone and make sure patient understands the importance of keeping this window for the FU appointment, as it is often an insurance requirement. Failing to adhere to this may result in losing coverage for sleep apnea treatment, at which point most patients are left with a choice of returning the machine or paying out of pocket (and we want neither of this to happen!).  Please re-enforce the importance of compliance with treatment and the need for us to monitor compliance data - again an insurance requirement and usually a good feedback for the patient as far as how they are doing.  Also remind patient, that any PAP machine or mask issues should be first addressed with the DME company, who provided the machine/mask.  Please ask if patient has a preference regarding DME company, may depend on the insurance too.  Please arrange for CPAP set up at home through a DME company of patient's choice.  Once you have spoken to the patient you can close the phone encounter. Please fax/route report to referring provider, thanks,   Huston FoleySaima Athar, MD, PhD [facility]

## 2017-12-27 NOTE — Procedures (Signed)
PATIENT'S NAME:  Madeline Chang, Ardys DOB:      07/31/1966      MR#:    [id number]     DATE OF RECORDING: 12/25/2017 REFERRING M.D.:  Knox RoyaltyEnrico Jones, MD Study Performed:  Split-Night Titration Study HISTORY: 52 year old woman with a history of diabetes, hypertension, status post toe amputations, and morbid obesity, who reports snoring, witnessed apneas and excessive daytime somnolence. The patient endorsed the Epworth Sleepiness Scale at 2/24 points. The patient's weight 244 pounds with a height of 64 (inches), resulting in a BMI of 42.8 kg/m2. The patient's neck circumference measured 19.4 inches.  CURRENT MEDICATIONS: Aspirin, Atorvastatin, Gabapentin, Humalog, Hydrocodone, Insulin, Invokana, Janumet, Levemir, Losartan, Metformin, Multi-Vitamin and Naproxen.   PROCEDURE:  This is a multichannel digital polysomnogram utilizing the Somnostar 11.2 system.  Electrodes and sensors were applied and monitored per AASM Specifications.   EEG, EOG, Chin and Limb EMG, were sampled at 200 Hz.  ECG, Snore and Nasal Pressure, Thermal Airflow, Respiratory Effort, CPAP Flow and Pressure, Oximetry was sampled at 50 Hz. Digital video and audio were recorded.      BASELINE STUDY WITHOUT CPAP RESULTS:  Lights Out was at 22:05 and Lights On at 05:04 for the night, split start at 02:15 AM, epoch 565. Total recording time (TRT) was 247.5, with a total sleep time (TST) of 127 minutes.   The patient's sleep latency was 93.5 minutes, which is markedly delayed. REM latency was 105.5 minutes. The sleep efficiency was 51.3 %, which is reduced.    SLEEP ARCHITECTURE: WASO (Wake after sleep onset) was 9.5 minutes, Stage N1 was 6 minutes, Stage N2 was 103.5 minutes, Stage N3 was 0 minutes and Stage R (REM sleep) was 17.5 minutes.  The percentages were Stage N1 4.7%, Stage N2 81.5%, which is increased, Stage N3 was absent, and Stage R (REM sleep) 13.8%. The arousals were noted as: 3 were spontaneous, 0 were associated with PLMs, 120 were  associated with respiratory events.  Audio and video analysis did not show any abnormal or unusual movements, behaviors, phonations or vocalizations. The patient took 2 bathroom breaks for the night. Mild to moderate snoring was noted. The EKG was in keeping with normal sinus rhythm (NSR).   RESPIRATORY ANALYSIS:  There were a total of 120 respiratory events:  20 obstructive apneas, 0 central apneas and 0 mixed apneas with a total of 20 apneas and an apnea index (AI) of 9.4. There were 100 hypopneas with a hypopnea index of 47.2. The patient also had 0 respiratory event related arousals (RERAs).  Snoring was noted.     The total APNEA/HYPOPNEA INDEX (AHI) was 56.7 /hour and the total RESPIRATORY DISTURBANCE INDEX was 56.7 /hour.  20 events occurred in REM sleep and 184 events in NREM. The REM AHI was 68.6, /hour versus a non-REM AHI of 54.8 /hour. The patient spent 171.5 minutes sleep time in the supine position 57 minutes in non-supine. The supine AHI was 82.3 /hour versus a non-supine AHI of 15.9 /hour.  OXYGEN SATURATION & C02:  The wake baseline 02 saturation was 92%, with the lowest being 50%. Time spent below 89% saturation equaled 120 minutes.  PERIODIC LIMB MOVEMENTS: The patient had a total of 5 Periodic Limb Movements.  The Periodic Limb Movement (PLM) index was 2.4 /hour and the PLM Arousal index was 0 /hour.  TITRATION STUDY WITH CPAP RESULTS:   The patient was fitted with a small Dreamwear nasal mask. CPAP was initiated at 5 cmH20 with heated humidity per AASM  split night standards and pressure was advanced to 9 cmH20 because of hypopneas, apneas and desaturations. At a PAP pressure of 9 cmH20, there was a reduction of the AHI to 0/hour with supine NREM sleep achieved and O2 nadir of 87%.    Total recording time (TRT) was 171.5 minutes, with a total sleep time (TST) of 101 minutes. The patient's sleep latency was 82.5 minutes. REM latency was 59.5 minutes.  The sleep efficiency was 58.9  %.    SLEEP ARCHITECTURE: Wake after sleep was 18 minutes, Stage N1 6 minutes, Stage N2 63.5 minutes, Stage N3 0 minutes and Stage R (REM sleep) 31.5 minutes. The percentages were: Stage N1 5.9%, Stage N2 62.9%, Stage N3 was absent, and Stage R (REM sleep) 31.2%, which is increased, and in keeping with rebound. The arousals were noted as: 8 were spontaneous, 0 were associated with PLMs, 10 were associated with respiratory events.  RESPIRATORY ANALYSIS:  There were a total of 10 respiratory events: 0 obstructive apneas, 0 central apneas and 0 mixed apneas with a total of 0 apneas and an apnea index (AI) of 0. There were 10 hypopneas with a hypopnea index of 5.9 /hour. The patient also had 0 respiratory event related arousals (RERAs).      The total APNEA/HYPOPNEA INDEX  (AHI) was 5.9 /hour and the total RESPIRATORY DISTURBANCE INDEX was 5.9 /hour.  2 events occurred in REM sleep and 8 events in NREM. The REM AHI was 3.8 /hour versus a non-REM AHI of 6.9 /hour. REM sleep was achieved on a pressure of  cm/h2o (AHI was  .) The patient spent 93% of total sleep time in the supine position. The supine AHI was 5.8 /hour, versus a non-supine AHI of 8.0/hour.  OXYGEN SATURATION & C02:  The wake baseline 02 saturation was 94%, with the lowest being 81%. Time spent below 89% saturation equaled 52 minutes.  PERIODIC LIMB MOVEMENTS: The patient had a total of 0 Periodic Limb Movements. The Periodic Limb Movement (PLM) index was 0 /hour and the PLM Arousal index was 0 /hour.  Post-study, the patient indicated that sleep was better than usual.  POLYSOMNOGRAPHY IMPRESSION :   1. Obstructive Sleep Apnea (OSA)   RECOMMENDATIONS:  1. This patient has severe obstructive sleep apnea and responded well on CPAP therapy. Given the absence of REM sleep on the final titration pressure of 9 cm and O2 nadir of 87%, I recommend an home CPAP treatment pressure of 10 cm via small nasal mask with heated humidity. The patient  should be reminded to be fully compliant with PAP therapy to improve sleep related symptoms and decrease long term cardiovascular risks. Please note that untreated obstructive sleep apnea carries additional perioperative morbidity. Patients with significant obstructive sleep apnea should receive perioperative PAP therapy and the surgeons and particularly the anesthesiologist should be informed of the diagnosis and the severity of the sleep disordered breathing. 2. The patient should be cautioned not to drive, work at heights, or operate dangerous or heavy equipment when tired or sleepy. Review and reiteration of good sleep hygiene measures should be pursued with any patient. 3. The patient will be seen in follow-up by Dr. Frances Furbish at [facility] for discussion of the test results and further management strategies. The referring provider will be notified of the test results.  I certify that I have reviewed the entire raw data recording prior to the issuance of this report in accordance with the Standards of Accreditation of the American Academy of Sleep  Medicine (AASM)   Huston Foley, MD, PhD Diplomat, American Board of Psychiatry and Neurology (Neurology and Sleep Medicine)

## 2017-12-27 NOTE — Telephone Encounter (Signed)
I called pt. I advised pt that Dr. Frances FurbishAthar reviewed their sleep study results and found that pt has severe osa but did well with the cpap during her study. Dr. Frances FurbishAthar recommends that pt start a cpap at home. I reviewed PAP compliance expectations with the pt. Pt is agreeable to starting a CPAP. I advised pt that an order will be sent to a DME, [facility], and [facility] will call the pt within about one week after they file with the pt's insurance. [facility] will show the pt how to use the machine, fit for masks, and troubleshoot the CPAP if needed. A follow up appt was made for insurance purposes with Dr. Frances FurbishAthar on 03/07/18 at 1:00pm. Pt verbalized understanding to arrive 15 minutes early and bring their CPAP. A letter with all of this information in it will be mailed to the pt as a reminder. I verified with the pt that the address we have on file is correct. Pt verbalized understanding of results. Pt had no questions at this time but was encouraged to call back if questions arise.

## 2017-12-27 NOTE — Progress Notes (Signed)
Patient referred by Dr. Yetta BarreJones, seen by me on 12/13/17, split night sleep study on 12/25/17. Please call and notify patient that the recent sleep study confirmed the diagnosis of severe OSA. She did well with CPAP during the study with significant improvement of the respiratory events. Therefore, I would like start the patient on CPAP therapy at home by prescribing a machine for home use. I placed the order in the chart.  Please advise patient that we need a follow up appointment with either myself or one of our nurse practitioners in about 10 weeks post set-up to check for how the patient is feeling and how well the patient is using the machine, etc. Please go ahead and schedule the appointment, while you have the patient on the phone and make sure patient understands the importance of keeping this window for the FU appointment, as it is often an insurance requirement. Failing to adhere to this may result in losing coverage for sleep apnea treatment, at which point most patients are left with a choice of returning the machine or paying out of pocket (and we want neither of this to happen!).  Please re-enforce the importance of compliance with treatment and the need for us to monitor compliance data - again an insurance requirement and usually a good feedback for the patient as far as how they are doing.  Also remind patient, that any PAP machine or mask issues should be first addressed with the DME company, who provided the machine/mask.  Please ask if patient has a preference regarding DME company, may depend on the insurance too.  Please arrange for CPAP set up at home through a DME company of patient's choice.  Once you have spoken to the patient you can close the phone encounter. Please fax/route report to referring provider, thanks,   Huston FoleySaima Saori Umholtz, MD, PhD [facility])

## 2018-03-01 ENCOUNTER — Encounter (HOSPITAL_BASED_OUTPATIENT_CLINIC_OR_DEPARTMENT_OTHER): Payer: BLUE CROSS/BLUE SHIELD | Attending: Internal Medicine

## 2018-03-01 DIAGNOSIS — E1140 Type 2 diabetes mellitus with diabetic neuropathy, unspecified: Secondary | ICD-10-CM | POA: Insufficient documentation

## 2018-03-01 DIAGNOSIS — L84 Corns and callosities: Secondary | ICD-10-CM | POA: Diagnosis not present

## 2018-03-01 DIAGNOSIS — G4730 Sleep apnea, unspecified: Secondary | ICD-10-CM | POA: Diagnosis not present

## 2018-03-01 DIAGNOSIS — L97522 Non-pressure chronic ulcer of other part of left foot with fat layer exposed: Secondary | ICD-10-CM | POA: Insufficient documentation

## 2018-03-01 DIAGNOSIS — Z89432 Acquired absence of left foot: Secondary | ICD-10-CM | POA: Insufficient documentation

## 2018-03-01 DIAGNOSIS — E11621 Type 2 diabetes mellitus with foot ulcer: Secondary | ICD-10-CM | POA: Diagnosis present

## 2018-03-01 DIAGNOSIS — I10 Essential (primary) hypertension: Secondary | ICD-10-CM | POA: Diagnosis not present

## 2018-03-06 ENCOUNTER — Encounter: Payer: Self-pay | Admitting: Neurology

## 2018-03-07 ENCOUNTER — Ambulatory Visit: Payer: BLUE CROSS/BLUE SHIELD | Admitting: Neurology

## 2018-03-08 ENCOUNTER — Ambulatory Visit: Payer: BLUE CROSS/BLUE SHIELD | Admitting: Neurology

## 2018-03-08 ENCOUNTER — Encounter: Payer: Self-pay | Admitting: Neurology

## 2018-03-08 VITALS — BP 147/75 | HR 101 | Ht 63.5 in | Wt 242.0 lb

## 2018-03-08 DIAGNOSIS — G4733 Obstructive sleep apnea (adult) (pediatric): Secondary | ICD-10-CM

## 2018-03-08 DIAGNOSIS — G4734 Idiopathic sleep related nonobstructive alveolar hypoventilation: Secondary | ICD-10-CM

## 2018-03-08 DIAGNOSIS — Z9989 Dependence on other enabling machines and devices: Secondary | ICD-10-CM

## 2018-03-08 NOTE — Progress Notes (Signed)
Subjective:    Patient ID: Madeline Chang is a 52 y.o. female.  HPI     Interim history:  Madeline Chang is a 52 year old right-handed woman with an underlying medical history of diabetes, hypertension, status post toe amputations, and morbid obesity with a BMI of over 41, who presents for follow-up consultation of her obstructive sleep apnea, after sleep study testing. The patient is unaccompanied today. I first met her on 12/13/2017 at the request of her primary care physician, at which time she reported snoring, daytime somnolence as well as witnessed apneas. She was advised to proceed with sleep study testing. She had a split-night sleep study on 12/25/2017. I went over her test results with her in detail today. Baseline sleep efficiency was reduced at 51.3%, sleep latency was 93.5 minutes. REM latency was 105.5 minutes. She had absence of slow-wave sleep, an increased percentage of stage II sleep and REM sleep was 13.8%. Total AHI was 56.7 per hour, REM AHI was 68.6 per hour, supine AHI 82.3 per hour. Average oxygen saturation was 92%, nadir was 50%. She had no significant PLMS. She was fitted with a small dream where nasal mask. CPAP was titrated from 5 cm to 9 cm. On the final pressure her AHI was 0 per hour with supine non-REM sleep achieved an O2 nadir of 87%. Based on these test results I suggested a home CPAP pressure of 10 cm.  Today, 03/08/2018: I reviewed her compliance data from 02/05/2018 through 03/06/2018 which is a total of 30 days, during which time she used her CPAP every night with percent used days greater than 4 hours at 50%, indicating suboptimal compliance with an average usage of 4 hours and 5 minutes only, residual AHI at goal at 1.4 per hour, leak on the high side with the 95th percentile at 37.4 L/m on a pressure of 10 cm with EPR of 3. She reports doing well, feeling better, especially with respect to his sleep quality and daytime somnolence and daytime energy. She does admit that  she does not always keep the CPAP on through the morning. When she gets up for her morning prayer she often lays back down and sleeps a little bit but does not use her CPAP at the time. She is motivated to do better with that. She has had some issues with her foot, leaking wound from the amputations, seeing wound care. She does notice the leak from the mask, did not get the mask I prescribed as it was not available at the time.   The patient's allergies, current medications, family history, past medical history, past social history, past surgical history and problem list were reviewed and updated as appropriate.   Previously:   12/13/2017: (She) reports snoring, witnessed apneas and excessive daytime somnolence. She has woken up with a sense of gasping for air. She says that she was supposed to have sleep evaluation a couple of years ago even. I reviewed your office note from 10/25/2017, which you kindly included. She presented to urgent care on 12/04/2017 with dizziness. She was found to be mildly dehydrated. She was advised to try meclizine over-the-counter. She states that she tried something else over-the-counter, sounds like an over-the-counter sleep aid. She took 1 pill and since she felt better the next day she did not continue with any over-the-counter medication. She goes to bed around midnight or so. It takes her maybe an hour to fall asleep. Wake up time in the morning is typically 10 AM to 10:30 AM.  She does have nocturia about 3-4 times per average night. She has had occasional morning headaches. She is not aware of any family history of OSA. She denies telltale symptoms of restless legs but has had low back pain and also leg pain on the left more than right. She drinks caffeine in the form of coffee, usually 2 cups in the morning and 1 cup of tea per day, rare sodas. She lives at home with her husband and 2 of her daughters. Her oldest daughter is just finished with med school and has applied for  residency programs. She has 1 son and 3 daughters. They moved from [place] about 4 years ago but have lived in [place] and [place] before. She does not work. She takes care of her 35-year-old granddaughter during the day. Her Epworth sleepiness score is 2 out of 24, fatigue score is 29 out of 63.  Her Past Medical History Is Significant For: Past Medical History:  Diagnosis Date  . Diabetes mellitus without complication ([place])   . Hypertension   . Lipid disorder   . Peripheral neuropathy     Her Past Surgical History Is Significant For: Past Surgical History:  Procedure Laterality Date  . amputation 2 toes    . CESAREAN SECTION      Her Family History Is Significant For: Family History  Problem Relation Age of Onset  . Diabetes Mother   . Diabetes Father   . Diabetes Sister     Her Social History Is Significant For: Social History   Socioeconomic History  . Marital status: Married    Spouse name: Not on file  . Number of children: Not on file  . Years of education: Not on file  . Highest education level: Not on file  Occupational History  . Not on file  Social Needs  . Financial resource strain: Not on file  . Food insecurity:    Worry: Not on file    Inability: Not on file  . Transportation needs:    Medical: Not on file    Non-medical: Not on file  Tobacco Use  . Smoking status: Never Smoker  . Smokeless tobacco: Never Used  Substance and Sexual Activity  . Alcohol use: No  . Drug use: No  . Sexual activity: Not on file  Lifestyle  . Physical activity:    Days per week: Not on file    Minutes per session: Not on file  . Stress: Not on file  Relationships  . Social connections:    Talks on phone: Not on file    Gets together: Not on file    Attends religious service: Not on file    Active member of club or organization: Not on file    Attends meetings of clubs or organizations: Not on file    Relationship status: Not on file  Other Topics Concern  . Not on file   Social History Narrative  . Not on file    Her Allergies Are:  No Known Allergies:   Her Current Medications Are:  Outpatient Encounter Medications as of 03/08/2018  Medication Sig  . aspirin 81 MG tablet Take 81 mg by mouth daily.  Marland Kitchen atorvastatin (LIPITOR) 20 MG tablet Take 20 mg by mouth every morning.  . gabapentin (NEURONTIN) 100 MG capsule Take 100 mg by mouth 3 (three) times daily.  Marland Kitchen HYDROcodone-acetaminophen (NORCO/VICODIN) 5-325 MG per tablet Take 1 tablet by mouth every 6 (six) hours as needed for moderate pain.  Marland Kitchen  insulin aspart (NOVOLOG FLEXPEN) 100 UNIT/ML FlexPen Inject 1-8 Units into the skin 3 (three) times daily with meals.  . INVOKANA 300 MG TABS tablet Take 300 mg by mouth daily.  Marland Kitchen JANUMET 50-1000 MG tablet Take 1 tablet by mouth 2 (two) times daily.  Marland Kitchen LEVEMIR FLEXTOUCH 100 UNIT/ML Pen Inject 98 Units into the skin daily before breakfast.  . losartan (COZAAR) 50 MG tablet Take 50 mg by mouth daily.  . metFORMIN (GLUCOPHAGE) 1000 MG tablet Take 1,000 mg by mouth See admin instructions. TWO TIMES A DAY IN LIEU OF JANUMET ONLY IF A SULFA DRUG HAS BEEN TEMPORARILY PRESCRIBED  . Multiple Vitamins-Minerals (CENTRUM PO) Take 1 tablet by mouth daily.  . naproxen (NAPROSYN) 500 MG tablet Take 500 mg by mouth 2 (two) times daily.  . [DISCONTINUED] HUMALOG 100 UNIT/ML injection Inject 28 Units into the skin 2 (two) times daily before a meal. MORNING AND BEDTIME   No facility-administered encounter medications on file as of 03/08/2018.   :  Review of Systems:  Out of a complete 14 point review of systems, all are reviewed and negative with the exception of these symptoms as listed below: Review of Systems  Neurological:       Pt presents today to discuss her cpap. Pt reports that she feels better but does have a mask leak.    Objective:  Neurological Exam  Physical Exam Physical Examination:   Vitals:   03/08/18 1414  BP: (!) 147/75  Pulse: (!) 101   General  Examination: The patient is a very pleasant 52 y.o. female in no acute distress. She appears well-developed and well-nourished and well groomed.   HEENT: Normocephalic, atraumatic, pupils are equal, round and reactive to light and accommodation. Extraocular tracking is good without limitation to gaze excursion or nystagmus noted. Normal smooth pursuit is noted. Hearing is grossly intact. Face is symmetric with normal facial animation and normal facial sensation. Speech is clear with no dysarthria noted. There is no hypophonia. There is no lip, neck/head, jaw or voice tremor. Neck is supple with full range of passive and active motion. There are no carotid bruits on auscultation. Oropharynx exam reveals: mild mouth dryness, adequate dental hygiene and moderate airway crowding. Tongue protrudes centrally and palate elevates symmetrically.  Chest: Clear to auscultation without wheezing, rhonchi or crackles noted.  Heart: S1+S2+0, regular and normal without murmurs, rubs or gallops noted.   Abdomen: Soft, non-tender and non-distended with normal bowel sounds appreciated on auscultation.  Extremities: There is trace pitting edema in the distal lower extremities bilaterally. Pedal pulses are intact.  Skin: Warm and dry without trophic changes noted.  Musculoskeletal: exam reveals no obvious joint deformities, tenderness or joint swelling or erythema, s/p toe amputations 2nd and 3rd toes on the L (I did not inspect wound)  Neurologically:  Mental status: The patient is awake, alert and oriented in all 4 spheres. Her immediate and remote memory, attention, language skills and fund of knowledge are appropriate. There is no evidence of aphasia, agnosia, apraxia or anomia. Speech is clear with normal prosody and enunciation. Thought process is linear. Mood is normal and affect is normal.   Cranial nerves II - XII are as described above under HEENT exam. In addition: shoulder shrug is normal with equal  shoulder height noted. Motor exam: Normal bulk, strength and tone is noted. There is no tremor. Reflexes are 1+ in the upper extremities and trace in the lower extremities. Fine motor skills are grossly intact.  Cerebellar  testing: No dysmetria or intention tremor.  Sensory exam: intact to light touch in the upper and lower extremities.  Gait, station and balance: She stands with mild difficulty. No veering to one side is noted. No leaning to one side is noted. Posture is age-appropriate and stance is narrow based. Gait shows normal stride length and normal pace. No problems turning are noted.   Assessment and Plan:   In summary, Madeline Chang is a very pleasant 52 year old female with an underlying medical history of diabetes, hypertension, status post toe amputations, and morbid obesity with a BMI of over 40, who presents for follow-up consultation of her severe obstructive sleep apnea. She had a split-night sleep study in January 2019. She has established CPAP therapy at a pressure of 10 cm. While she is compliant with putting CPAP on every night without fail, she has not always been using CPAP through the night. She is strongly encouraged to be fully compliant with her CPAP therapy including early morning hours as she has critical desaturations during REM sleep as demonstrated during her baseline portion of the study. She has adapted well to treatment and is motivated to continue and also motivated to use it through the night including the morning hours. She will need a mask refit. She did not get the mask that I originally prescribed as her DME company did not have it available at the time. She may need a chinstrap as well. In addition, because of her severe desaturations during sleep and particularly during REM sleep, I would like to see that she has gotten better with CPAP in that regard and we will proceed with an overnight pulse oximetry test at her convenience. We will call her with her test  results; while she is using CPAP she is advised to use a pulse ox 1 night.this will be arranged through her current DME company. I will see her back routinely in 6 months, sooner if needed. She is commended for her treatment adherence thus far. I answered all her questions today and she was in agreement. I spent 25 minutes in total face-to-face time with the patient, more than 50% of which was spent in counseling and coordination of care, reviewing test results, reviewing medication and discussing or reviewing the diagnosis of OSA, its prognosis and treatment options. Pertinent laboratory and imaging test results that were available during this visit with the patient were reviewed by me and considered in my medical decision making (see chart for details).

## 2018-03-08 NOTE — Patient Instructions (Addendum)
Please continue using your CPAP regularly. While your insurance requires that you use CPAP at least 4 hours each night on 70% of the nights, I recommend, that you not skip any nights and use it throughout the night if you can. Getting used to CPAP and staying with the treatment long term does take time and patience and discipline. Untreated obstructive sleep apnea when it is moderate to severe can have an adverse impact on cardiovascular health and raise her risk for heart disease, arrhythmias, hypertension, congestive heart failure, stroke and diabetes. Untreated obstructive sleep apnea causes sleep disruption, nonrestorative sleep, and sleep deprivation. This can have an impact on your day to day functioning and cause daytime sleepiness and impairment of cognitive function, memory loss, mood disturbance, and problems focussing. Using CPAP regularly can improve these symptoms. You have done a good job so far with using CPAP, but please try to be consistent with keeping CPAP on through the morning.  We will have you meet with your DME for a mask refit and to pick up a chinstrap. As discussed, we will do an overnight oxygen level test, called ONO, and your DME company will call and set this up for one night, while you also use your autoPAP as usual. We will call you with the results. This is to make sure that your oxygen levels stay in the 90s, while you are treated with autoPAP for your OSA. Remember, your oxygen levels dropped into the 50s during the home sleep test.

## 2018-03-09 DIAGNOSIS — E11621 Type 2 diabetes mellitus with foot ulcer: Secondary | ICD-10-CM | POA: Diagnosis not present

## 2018-03-14 ENCOUNTER — Encounter: Payer: Self-pay | Admitting: Neurology

## 2018-03-15 ENCOUNTER — Encounter (HOSPITAL_BASED_OUTPATIENT_CLINIC_OR_DEPARTMENT_OTHER): Payer: BLUE CROSS/BLUE SHIELD | Attending: Internal Medicine

## 2018-03-15 DIAGNOSIS — E1140 Type 2 diabetes mellitus with diabetic neuropathy, unspecified: Secondary | ICD-10-CM | POA: Diagnosis not present

## 2018-03-15 DIAGNOSIS — Z09 Encounter for follow-up examination after completed treatment for conditions other than malignant neoplasm: Secondary | ICD-10-CM | POA: Diagnosis not present

## 2018-03-15 DIAGNOSIS — Z8631 Personal history of diabetic foot ulcer: Secondary | ICD-10-CM | POA: Diagnosis not present

## 2018-03-19 ENCOUNTER — Telehealth: Payer: Self-pay | Admitting: Neurology

## 2018-03-19 DIAGNOSIS — Z9989 Dependence on other enabling machines and devices: Secondary | ICD-10-CM

## 2018-03-19 DIAGNOSIS — G4730 Sleep apnea, unspecified: Secondary | ICD-10-CM

## 2018-03-19 DIAGNOSIS — G4734 Idiopathic sleep related nonobstructive alveolar hypoventilation: Secondary | ICD-10-CM

## 2018-03-19 NOTE — Telephone Encounter (Signed)
I called pt. She reports being on her cpap the whole time during the ONO. Pt understand that I will call her with ONO results after they have been reviewed by Dr. Frances FurbishAthar.

## 2018-03-19 NOTE — Telephone Encounter (Signed)
Please advise patient that her recent pulse oximetry test from 03/14/2018 showed ongoing issues with lower oxygen saturations, average oxygen desaturation was only 89%, nadir was 74%, time below or at 88% saturation was nearly 2 hours. Therefore, I would recommend that we bring her in for a full night CPAP titration study and evaluation for need for supplemental oxygen. She may need a higher CPAP pressure as well which may help reduce the oxygen desaturations. I will order a CPAP titration study.

## 2018-03-19 NOTE — Telephone Encounter (Signed)
I called pt, explained her ONO results. Pt is agreeable to a cpap titration study and understands that our sleep lab will call her to schedule this study after filing with pt's insurance. Pt understands that her O2 levels were still low during the ONO, and pt may need supplemental O2 and a higher cpap pressure. Pt verbalized understanding of results. Pt had no questions at this time but was encouraged to call back if questions arise.

## 2018-04-26 ENCOUNTER — Ambulatory Visit: Payer: BLUE CROSS/BLUE SHIELD | Admitting: Neurology

## 2018-06-04 ENCOUNTER — Ambulatory Visit (INDEPENDENT_AMBULATORY_CARE_PROVIDER_SITE_OTHER): Payer: BLUE CROSS/BLUE SHIELD | Admitting: Neurology

## 2018-06-04 DIAGNOSIS — G4733 Obstructive sleep apnea (adult) (pediatric): Secondary | ICD-10-CM | POA: Diagnosis not present

## 2018-06-04 DIAGNOSIS — G4720 Circadian rhythm sleep disorder, unspecified type: Secondary | ICD-10-CM

## 2018-06-21 ENCOUNTER — Other Ambulatory Visit: Payer: Self-pay | Admitting: Neurology

## 2018-06-21 ENCOUNTER — Telehealth: Payer: Self-pay | Admitting: *Deleted

## 2018-06-21 DIAGNOSIS — G4733 Obstructive sleep apnea (adult) (pediatric): Secondary | ICD-10-CM

## 2018-06-21 DIAGNOSIS — Z9989 Dependence on other enabling machines and devices: Secondary | ICD-10-CM

## 2018-06-21 NOTE — Telephone Encounter (Signed)
Called pt. Relayed results per Dr. Frances FurbishAthar note. She verbalized understanding. Advised we will send new orders to [facility] for her. She requested new mask. Spoke with Dr. Frances FurbishAthar and this is on new order. She knows to keep f/u in October.   Faxed order to [facility] with sleep study results at [phone]. Received fax confirmation.

## 2018-06-21 NOTE — Telephone Encounter (Signed)
-----   Message from Huston FoleySaima Athar, MD sent at 06/21/2018  8:30 AM EDT ----- Patient referred by Dr. Yetta BarreJones, seen initially by me on 12/13/17, split night sleep study on 12/25/17. Patient had a repeat PAP titration study on 06/04/18.  Please call and inform patient that I have entered an order for treatment with positive airway pressure (PAP) treatment for obstructive sleep apnea (OSA). She did well enough during the latest sleep study with CPAP of 14 cm, but may require a higher pressure at home, no oxygen was added, which is good news. I will change her CPAP to 14 cm for now. She can keep FU for Oct, which is scheduled already. Pls fax order to her DME, [facility] I believe.  Huston FoleySaima Athar, MD, PhD [facility])

## 2018-06-21 NOTE — Progress Notes (Signed)
Patient referred by Dr. Yetta BarreJones, seen initially by me on 12/13/17, split night sleep study on 12/25/17. Patient had a repeat PAP titration study on 06/04/18.  Please call and inform patient that I have entered an order for treatment with positive airway pressure (PAP) treatment for obstructive sleep apnea (OSA). She did well enough during the latest sleep study with CPAP of 14 cm, but may require a higher pressure at home, no oxygen was added, which is good news. I will change her CPAP to 14 cm for now. She can keep FU for Oct, which is scheduled already. Pls fax order to her DME, [facility] I believe.  Huston FoleySaima Autumnrose Yore, MD, PhD [facility])

## 2018-06-21 NOTE — Procedures (Signed)
PATIENT'S NAME:  Madeline Chang, Madeline Chang DOB:      1965/12/24      MR#:    [id number]     DATE OF RECORDING: 06/04/2018 REFERRING M.D.:  Knox Royalty, MD Study Performed:   CPAP  Titration HISTORY: 52 year old woman with a history of diabetes, hypertension, and obesity, who returns for a full night PAP titration for her severe OSA, not well enough treated on AutoPAP. The patient's weight 243 pounds with a height of 63 (inches), resulting in a BMI of 43. kg/m2. The patient's neck circumference measured 19.4 inches.  CURRENT MEDICATIONS: Aspirin, Atorvastatin, Gabapentin, Humalog, Hydrocodone, Insulin, Invokana, Janumet, Levemir, Losartan, Metformin, Multi-Vitamin and Naproxen   PROCEDURE:  This is a multichannel digital polysomnogram utilizing the SomnoStar 11.2 system.  Electrodes and sensors were applied and monitored per AASM Specifications.   EEG, EOG, Chin and Limb EMG, were sampled at 200 Hz.  ECG, Snore and Nasal Pressure, Thermal Airflow, Respiratory Effort, CPAP Flow and Pressure, Oximetry was sampled at 50 Hz. Digital video and audio were recorded.      The patient was fitted with a small Dreamwear nasal mask. CPAP was initiated at 8 cmH20 with heated humidity per AASM standards and pressure was advanced to 14 cmH20 because of hypopneas, apneas and desaturations.  At a PAP pressure of 14 cmH20, there was a reduction of the AHI to 0/hour, with supine NREM sleep achieved and O2 nadir of 90%.  Lights Out was at 23:03 and Lights On at 05:02. Total recording time (TRT) was 360 minutes, with a total sleep time (TST) of 272 minutes. The patient's sleep latency was 56.5 minutes, which is delayed. REM latency was 65.5 minutes, which is low normal. The sleep efficiency was 75.6 %.    SLEEP ARCHITECTURE: WASO (Wake after sleep onset) was 48 minutes with mild sleep fragmentation noted and one longer period of wakefulness. There were 24.5 minutes in Stage N1, 68.5 minutes Stage N2, 95 minutes Stage N3 and 84 minutes  in Stage REM.  The percentage of Stage N1 was 9.%, which is increased, Stage N2 was 25.2%, Stage N3 was 34.9%, which is markedly increased, and Stage R (REM sleep) was 30.9%, which is markedly increased, and in keeping with rebound. The arousals were noted as: 48 were spontaneous, 0 were associated with PLMs, 2 were associated with respiratory events.   RESPIRATORY ANALYSIS: There was a total of 21 respiratory events: 0 obstructive apneas, 1 central apneas and 0 mixed apneas with a total of 1 apneas and an apnea index (AI) of .2 /hour. There were 20 hypopneas with a hypopnea index of 4.4/hour. The patient also had 0 respiratory event related arousals (RERAs).      The total APNEA/HYPOPNEA INDEX  (AHI) was 4.6/hour and the total RESPIRATORY DISTURBANCE INDEX was 4.6 0./hour  18 events occurred in REM sleep and 3 events in NREM. The REM AHI was 12.9 /hour versus a non-REM AHI of 1. 0./hour.  The patient spent 167.5 minutes of total sleep time in the supine position and 105 minutes in non-supine. The supine AHI was 7.6, versus a non-supine AHI of 0.0.  OXYGEN SATURATION & C02:  The baseline 02 saturation was 92%, with the lowest being 83%. Time spent below 89% saturation equaled 16 minutes.  PERIODIC LIMB MOVEMENTS:  The patient had a total of 0 Periodic Limb Movements. The Periodic Limb Movement (PLM) index was 0 and the PLM Arousal index was 0 /hour.  Audio and video analysis did not show any  abnormal or unusual movements, behaviors, phonations or vocalizations. The patient took no bathroom breaks. The EKG was in keeping with normal sinus rhythm (NSR).  Post-study, the patient indicated that sleep was better than usual.   DIAGNOSIS 1. Obstructive Sleep Apnea  2. Dysfunctions associated with sleep stages or arousal from sleep  PLANS/RECOMMENDATIONS:  1. This study demonstrates resolution of the patient's obstructive sleep apnea with CPAP therapy. I will, therefore, change her treatment settings to  CPAP of 14 cm (may need 15 cm or higher to eliminate REM related OSA at home) nasal interface of choice. The patient should be reminded to be fully compliant with PAP therapy to improve sleep related symptoms and decrease long term cardiovascular risks. The patient should be reminded, that it may take up to 3 months to get fully used to using PAP with all planned sleep. The earlier full compliance is achieved, the better long term compliance tends to be. Please note that untreated obstructive sleep apnea may carry additional perioperative morbidity. Patients with significant obstructive sleep apnea should receive perioperative PAP therapy and the surgeons and particularly the anesthesiologist should be informed of the diagnosis and the severity of the sleep disordered breathing. 2. This study shows sleep fragmentation and abnormal sleep stage percentages; these are nonspecific findings and per se do not signify an intrinsic sleep disorder or a cause for the patient's sleep-related symptoms. Causes include (but are not limited to) the first night effect of the sleep study, circadian rhythm disturbances, medication effect or an underlying mood disorder or medical problem.  3. The patient should be cautioned not to drive, work at heights, or operate dangerous or heavy equipment when tired or sleepy. Review and reiteration of good sleep hygiene measures should be pursued with any patient. 4. The patient will be [facility] for discussion of the test results, symptom and treatment compliance review, further management strategies, etc. The referring provider will be notified of the test results.  I certify that I have reviewed the entire raw data recording prior to the issuance of this report in accordance with the Standards of Accreditation of the American Academy of Sleep Medicine (AASM)  Huston FoleySaima Kiante Petrovich, MD, PhD Diplomat, American Board of Neurology and Sleep Medicine (Neurology and  Sleep Medicine)

## 2018-09-11 ENCOUNTER — Ambulatory Visit: Payer: BLUE CROSS/BLUE SHIELD | Admitting: Neurology

## 2018-09-11 ENCOUNTER — Telehealth: Payer: Self-pay

## 2018-09-11 NOTE — Telephone Encounter (Signed)
Pt did not show for their appt with Dr. Athar today.  

## 2018-09-12 ENCOUNTER — Encounter: Payer: Self-pay | Admitting: Neurology

## 2018-09-17 ENCOUNTER — Encounter: Payer: Self-pay | Admitting: Neurology

## 2018-09-17 ENCOUNTER — Ambulatory Visit (INDEPENDENT_AMBULATORY_CARE_PROVIDER_SITE_OTHER): Payer: BLUE CROSS/BLUE SHIELD | Admitting: Neurology

## 2018-09-17 VITALS — BP 151/96 | HR 105 | Ht 63.5 in | Wt 241.0 lb

## 2018-09-17 DIAGNOSIS — Z9989 Dependence on other enabling machines and devices: Secondary | ICD-10-CM | POA: Diagnosis not present

## 2018-09-17 DIAGNOSIS — G4733 Obstructive sleep apnea (adult) (pediatric): Secondary | ICD-10-CM

## 2018-09-17 DIAGNOSIS — Z789 Other specified health status: Secondary | ICD-10-CM

## 2018-09-17 NOTE — Progress Notes (Signed)
Order for mask refit sent to [facility].  

## 2018-09-17 NOTE — Progress Notes (Signed)
Subjective:    Madeline Chang: Madeline Chang is a 52 y.o. female.  HPI     Interim history:   Madeline Chang is a 52 year old right-handed woman with an underlying medical history of diabetes, hypertension, status post toe amputations, and morbid obesity with a BMI of over 74, who presents for follow-up consultation of her obstructive sleep apnea, after recent sleep study for titration. The Madeline is unaccompanied today; Madeline Chang missed an appointment on 09/11/2018. I last saw her on 03/08/18, at which time Madeline Chang was not quite optimal with her CPAP compliance. I suggested we proceed with a pulse oximetry test at home while on CPAP therapy.   Her pulse oximetry test in the interim from 03/14/2018 showed ongoing issues with lower oxygen saturations, average oxygen desaturation was only 89%, nadir was 74%, time below or at 88% saturation was nearly 2 hours. We called her with her test results and Madeline Chang was advised to return for a full night titration study. Madeline Chang had a CPAP titration study on 06/04/2018. I went over her test results with her in detail today. Sleep efficiency was 75.6%, sleep latency 56.5 minutes. Madeline Chang had a normal REM latency. Madeline Chang had an increased percentage of slow-wave sleep and an increased percentage of REM sleep. CPAP was initiated at 8 cm and titrated to a pressure of 14 cm. On the final pressure, her AHI was 0 per hour, with supine NREM sleep achieved an O2 nadir of 90%. Based on her test results I suggested adjusting her on treatment setting to CPAP of 14 cm via nasal interface of choice.  Today, 09/17/2018: I reviewed her CPAP compliance data from 08/11/2018 through 09/09/2018 which is a total of 30 days, during which time Madeline Chang used her CPAP 28 days with percent used days greater than 4 hours at 33%, indicating significantly suboptimal compliance with an average usage of 3 hours and 21 minutes only, residual AHI at goal at 1.8 per hour, leak high with the 95th percentile at 52.5 L/m on a pressure of 14  cm with EPR of 3. In the past 90 days her compliance for more than 4 hours has been 29%. Madeline Chang reports a machine and mask on nightly or nearly nightly but Madeline Chang is not always able to keep it on, the mask is leaking and making noise, also causing her to have nasal dryness. Madeline Chang is using a dreamwear nasal interface. Sometimes Madeline Chang puts it on during the nap. Madeline Chang takes care of her 66-year-old granddaughter during the day. Granddaughter is about to start preschool twice a week. Madeline is motivated to continue with treatment. Madeline Chang has had some improvement in her diabetes control, A1c around 8 per her report, down from over 10.  Previously:   I first met her on 12/13/2017 at the request of her primary care physician, at which time Madeline Chang reported snoring, daytime somnolence as well as witnessed apneas. Madeline Chang was advised to proceed with sleep study testing. Madeline Chang had a split-night sleep study on 12/25/2017. I went over her test results with her in detail today. Baseline sleep efficiency was reduced at 51.3%, sleep latency was 93.5 minutes. REM latency was 105.5 minutes. Madeline Chang had absence of slow-wave sleep, an increased percentage of stage II sleep and REM sleep was 13.8%. Total AHI was 56.7 per hour, REM AHI was 68.6 per hour, supine AHI 82.3 per hour. Average oxygen saturation was 92%, nadir was 50%. Madeline Chang had no significant PLMS. Madeline Chang was fitted with a small dream where nasal mask. CPAP was titrated  from 5 cm to 9 cm. On the final pressure her AHI was 0 per hour with supine non-REM sleep achieved an O2 nadir of 87%. Based on these test results I suggested a home CPAP pressure of 10 cm.   03/08/2018: I reviewed her compliance data from 02/05/2018 through 03/06/2018 which is a total of 30 days, during which time Madeline Chang used her CPAP every night with percent used days greater than 4 hours at 50%, indicating suboptimal compliance with an average usage of 4 hours and 5 minutes only, residual AHI at goal at 1.4 per hour, leak on the high side  with the 95th percentile at 37.4 L/m on a pressure of 10 cm with EPR of 3.    12/13/2017: (Madeline Chang) reports snoring, witnessed apneas and excessive daytime somnolence. Madeline Chang has woken up with a sense of gasping for air. Madeline Chang says that Madeline Chang was supposed to have sleep evaluation a couple of years ago even. I reviewed your office note from 10/25/2017, which you kindly included. Madeline Chang presented to urgent care on 12/04/2017 with dizziness. Madeline Chang was found to be mildly dehydrated. Madeline Chang was advised to try meclizine over-the-counter. Madeline Chang states that Madeline Chang tried something else over-the-counter, sounds like an over-the-counter sleep aid. Madeline Chang took 1 pill and since Madeline Chang felt better the next day Madeline Chang did not continue with any over-the-counter medication. Madeline Chang goes to bed around midnight or so. It takes her maybe an hour to fall asleep. Wake up time in the morning is typically 10 AM to 10:30 AM. Madeline Chang does have nocturia about 3-4 times per average night. Madeline Chang has had occasional morning headaches. Madeline Chang is not aware of any family history of OSA. Madeline Chang denies telltale symptoms of restless legs but has had low back pain and also leg pain on the left more than right. Madeline Chang drinks caffeine in the form of coffee, usually 2 cups in the morning and 1 cup of tea per day, rare sodas. Madeline Chang lives at home with her husband and 2 of her daughters. Her oldest daughter is just finished with med school and has applied for residency programs. Madeline Chang has 1 son and 3 daughters. They moved from [place] about 4 years ago but have lived in [place] and [place] before. Madeline Chang does not work. Madeline Chang takes care of her 72-year-old granddaughter during the day. Her Epworth sleepiness score is 2 out of 24, fatigue score is 29 out of 63.   Her Past Medical History Is Significant For: Past Medical History:  Diagnosis Date  . Diabetes mellitus without complication ([place])   . Hypertension   . Lipid disorder   . Peripheral neuropathy     Her Past Surgical History Is Significant For: Past Surgical  History:  Procedure Laterality Date  . amputation 2 toes    . CESAREAN SECTION      Her Family History Is Significant For: Family History  Problem Relation Age of Onset  . Diabetes Mother   . Diabetes Father   . Diabetes Sister     Her Social History Is Significant For: Social History   Socioeconomic History  . Marital status: Married    Spouse name: Not on file  . Number of children: Not on file  . Years of education: Not on file  . Highest education level: Not on file  Occupational History  . Not on file  Social Needs  . Financial resource strain: Not on file  . Food insecurity:    Worry: Not on file    Inability: Not  on file  . Transportation needs:    Medical: Not on file    Non-medical: Not on file  Tobacco Use  . Smoking status: Never Smoker  . Smokeless tobacco: Never Used  Substance and Sexual Activity  . Alcohol use: No  . Drug use: No  . Sexual activity: Not on file  Lifestyle  . Physical activity:    Days per week: Not on file    Minutes per session: Not on file  . Stress: Not on file  Relationships  . Social connections:    Talks on phone: Not on file    Gets together: Not on file    Attends religious service: Not on file    Active member of club or organization: Not on file    Attends meetings of clubs or organizations: Not on file    Relationship status: Not on file  Other Topics Concern  . Not on file  Social History Narrative  . Not on file    Her Allergies Are:  No Known Allergies:   Her Current Medications Are:  Outpatient Encounter Medications as of 09/17/2018  Medication Sig  . aspirin 81 MG tablet Take 81 mg by mouth daily.  Marland Kitchen atorvastatin (LIPITOR) 20 MG tablet Take 20 mg by mouth every morning.  . gabapentin (NEURONTIN) 100 MG capsule Take 100 mg by mouth 3 (three) times daily.  Marland Kitchen HYDROcodone-acetaminophen (NORCO/VICODIN) 5-325 MG per tablet Take 1 tablet by mouth every 6 (six) hours as needed for moderate pain.  . INVOKANA  300 MG TABS tablet Take 300 mg by mouth daily.  Marland Kitchen JANUMET 50-1000 MG tablet Take 1 tablet by mouth 2 (two) times daily.  Marland Kitchen LEVEMIR FLEXTOUCH 100 UNIT/ML Pen Inject 98 Units into the skin daily before breakfast.  . losartan (COZAAR) 50 MG tablet Take 50 mg by mouth daily.  . metFORMIN (GLUCOPHAGE) 1000 MG tablet Take 1,000 mg by mouth See admin instructions. TWO TIMES A DAY IN LIEU OF JANUMET ONLY IF A SULFA DRUG HAS BEEN TEMPORARILY PRESCRIBED  . Multiple Vitamins-Minerals (CENTRUM PO) Take 1 tablet by mouth daily.  . naproxen (NAPROSYN) 500 MG tablet Take 500 mg by mouth 2 (two) times daily.  . [DISCONTINUED] insulin aspart (NOVOLOG FLEXPEN) 100 UNIT/ML FlexPen Inject 1-8 Units into the skin 3 (three) times daily with meals.   No facility-administered encounter medications on file as of 09/17/2018.   :  Review of Systems:  Out of a complete 14 point review of systems, all are reviewed and negative with the exception of these symptoms as listed below:  Review of Systems  Neurological:       Pt presents today to discuss her cpap. Pt reports that her mask is leaking.    Objective:  Neurological Exam  Physical Exam Physical Examination:   Vitals:   09/17/18 1400  BP: (!) 151/96  Pulse: (!) 105   General Examination: The Madeline is a very pleasant 52 y.o. female in no acute distress. Madeline Chang appears well-developed and well-nourished and well groomed.   HEENT:Normocephalic, atraumatic, pupils are equal, round and reactive to light and accommodation. Extraocular tracking is good without limitation to gaze excursion or nystagmus noted. Normal smooth pursuit is noted. Hearing is grossly intact. Face is symmetric with normal facial animation and normal facial sensation. Speech is clear with no dysarthria noted. There is no hypophonia. There is no lip, neck/head, jaw or voice tremor. Neck shows FROM. Oropharynx exam reveals: mildmouth dryness, adequatedental hygiene and moderateairway crowding.  Tongue  protrudes centrally and palate elevates symmetrically.  Chest:Clear to auscultation without wheezing, rhonchi or crackles noted.  Heart:S1+S2+0, regular and normal without murmurs, rubs or gallops noted.   Abdomen:Soft, non-tender and non-distended with normal bowel sounds appreciated on auscultation.  Extremities:There istracepitting edema in the distal lower extremities bilaterally.  Skin: Warm and dry without trophic changes noted.  Musculoskeletal: exam reveals no obvious joint deformities, tenderness or joint swelling or erythema. Of note, Madeline Chang is s/p toe amputations (2nd and 3rd toes on the L), wearing diabetic shoes.   Neurologically:  Mental status: The Madeline is awake, alert and oriented in all 4 spheres.Herimmediate and remote memory, attention, language skills and fund of knowledge are appropriate. There is no evidence of aphasia, agnosia, apraxia or anomia. Speech is clear with normal prosody and enunciation. Thought process is linear. Mood is normaland affect is normal.   Cranial nerves II - XII are as described above under HEENT exam.  Motor exam: Normal bulk, strength and tone is noted. There is no tremor. Reflexes are1+in the upper extremities and trace in the lower extremities. Fine motor skills are grossly intact. Cerebellar testing: No dysmetria or intention tremor.  Sensory exam: intact to light touch in the upper and lower extremities.  Gait, station and balance:Shestands with mild difficulty.No veering to one side is noted. No leaning to one side is noted. Posture is age-appropriate and stance is narrow based. Gait shows normalstride length and normalpace. No problems turning are noted.   Assessmentand Plan:   In summary,Shadavia Azharis a very pleasant 49 year oldfemalewith an underlying medical history of diabetes, hypertension, status post toe amputations, and morbid obesity with a BMI of over 40, who presents for follow-up  consultation of her severe obstructive sleep apnea. Madeline Chang had a split-night sleep study in January 2019. Madeline Chang started treatment on CPAP of 10 cm. Madeline Chang has been compliant as far as putting the CPAP on but Madeline Chang is not able to keep it on. Madeline Chang had an interim abnormal pulse oximetry test and a subsequent titration study in June 2019 after which I increased her treatment pressure to 14 cm. Again, Madeline Chang is noted to put the CPAP mask on on a nearly nightly basis but her average usage is about 3 hours. Madeline Chang is motivated to continue with treatment but has struggled with mask tolerance and dryness particularly with nasal dryness. Madeline Chang is advised to make an appointment for a mask refit with her DME company. Madeline Chang is advised to follow-up routinely in about 5 months with Cecille Rubin, nurse practitioner. Madeline Chang is commended for her treatment adherence and it's certainly Notable on the compliance data that Madeline Chang is trying to use her CPAP but not always able to keep it on. Madeline Chang is motivated to continue with treatment. I answered all her questions today and Madeline Chang was in agreement. I spent 25 minutes in total face-to-face time with the Madeline, more than 50% of which was spent in counseling and coordination of care, reviewing test results, reviewing medication and discussing or reviewing the diagnosis of OSA, its prognosis and treatment options. Pertinent laboratory and imaging test results that were available during this visit with the Madeline were reviewed by me and considered in my medical decision making (see chart for details).

## 2018-09-17 NOTE — Patient Instructions (Signed)
Please continue using your CPAP regularly. While your insurance requires that you use CPAP at least 4 hours each night on 70% of the nights, I recommend, that you not skip any nights and use it throughout the night if you can. Getting used to CPAP and staying with the treatment long term does take time and patience and discipline. Untreated obstructive sleep apnea when it is moderate to severe can have an adverse impact on cardiovascular health and raise her risk for heart disease, arrhythmias, hypertension, congestive heart failure, stroke and diabetes. Untreated obstructive sleep apnea causes sleep disruption, nonrestorative sleep, and sleep deprivation. This can have an impact on your day to day functioning and cause daytime sleepiness and impairment of cognitive function, memory loss, mood disturbance, and problems focussing. Using CPAP regularly can improve these symptoms.  You are not quite there yet with your compliance. Please make an appointment with [facility] for a mask refit, as your mask is leaking too much.   Please do the best you can with the CPAP.

## 2018-11-14 ENCOUNTER — Emergency Department (HOSPITAL_COMMUNITY): Payer: BLUE CROSS/BLUE SHIELD

## 2018-11-14 ENCOUNTER — Emergency Department (HOSPITAL_COMMUNITY)
Admission: EM | Admit: 2018-11-14 | Discharge: 2018-11-14 | Disposition: A | Discharge status: Home / Self Care | Payer: BLUE CROSS/BLUE SHIELD | Attending: Emergency Medicine | Admitting: Emergency Medicine

## 2018-11-14 ENCOUNTER — Encounter (HOSPITAL_COMMUNITY): Payer: Self-pay | Admitting: *Deleted

## 2018-11-14 DIAGNOSIS — Z794 Long term (current) use of insulin: Secondary | ICD-10-CM | POA: Insufficient documentation

## 2018-11-14 DIAGNOSIS — M5136 Other intervertebral disc degeneration, lumbar region: Secondary | ICD-10-CM

## 2018-11-14 DIAGNOSIS — I10 Essential (primary) hypertension: Secondary | ICD-10-CM | POA: Insufficient documentation

## 2018-11-14 DIAGNOSIS — Z7982 Long term (current) use of aspirin: Secondary | ICD-10-CM | POA: Diagnosis not present

## 2018-11-14 DIAGNOSIS — M5416 Radiculopathy, lumbar region: Secondary | ICD-10-CM | POA: Diagnosis not present

## 2018-11-14 DIAGNOSIS — M549 Dorsalgia, unspecified: Secondary | ICD-10-CM

## 2018-11-14 DIAGNOSIS — R1030 Lower abdominal pain, unspecified: Secondary | ICD-10-CM | POA: Diagnosis not present

## 2018-11-14 DIAGNOSIS — E1140 Type 2 diabetes mellitus with diabetic neuropathy, unspecified: Secondary | ICD-10-CM | POA: Diagnosis not present

## 2018-11-14 DIAGNOSIS — Z79899 Other long term (current) drug therapy: Secondary | ICD-10-CM | POA: Insufficient documentation

## 2018-11-14 DIAGNOSIS — M545 Low back pain: Secondary | ICD-10-CM | POA: Diagnosis present

## 2018-11-14 DIAGNOSIS — M5126 Other intervertebral disc displacement, lumbar region: Secondary | ICD-10-CM | POA: Diagnosis not present

## 2018-11-14 LAB — URINALYSIS, ROUTINE W REFLEX MICROSCOPIC
Bacteria, UA: NONE SEEN
Bilirubin Urine: NEGATIVE
Hgb urine dipstick: NEGATIVE
Ketones, ur: NEGATIVE mg/dL
Leukocytes, UA: NEGATIVE
Nitrite: NEGATIVE
PH: 6 (ref 5.0–8.0)
PROTEIN: NEGATIVE mg/dL
Specific Gravity, Urine: 1.022 (ref 1.005–1.030)

## 2018-11-14 LAB — CBC WITH DIFFERENTIAL/PLATELET
Abs Immature Granulocytes: 0.11 10*3/uL — ABNORMAL HIGH (ref 0.00–0.07)
BASOS ABS: 0.1 10*3/uL (ref 0.0–0.1)
Basophils Relative: 1 %
Eosinophils Absolute: 0.2 10*3/uL (ref 0.0–0.5)
Eosinophils Relative: 2 %
HEMATOCRIT: 42.2 % (ref 36.0–46.0)
Hemoglobin: 12.2 g/dL (ref 12.0–15.0)
Immature Granulocytes: 1 %
LYMPHS ABS: 3.3 10*3/uL (ref 0.7–4.0)
Lymphocytes Relative: 32 %
MCH: 22.6 pg — ABNORMAL LOW (ref 26.0–34.0)
MCHC: 28.9 g/dL — ABNORMAL LOW (ref 30.0–36.0)
MCV: 78 fL — ABNORMAL LOW (ref 80.0–100.0)
Monocytes Absolute: 1.1 10*3/uL — ABNORMAL HIGH (ref 0.1–1.0)
Monocytes Relative: 11 %
Neutro Abs: 5.4 10*3/uL (ref 1.7–7.7)
Neutrophils Relative %: 53 %
Platelets: 324 10*3/uL (ref 150–400)
RBC: 5.41 MIL/uL — ABNORMAL HIGH (ref 3.87–5.11)
RDW: 15.8 % — ABNORMAL HIGH (ref 11.5–15.5)
WBC: 10.1 10*3/uL (ref 4.0–10.5)
nRBC: 0 % (ref 0.0–0.2)

## 2018-11-14 LAB — COMPREHENSIVE METABOLIC PANEL
ALBUMIN: 3.7 g/dL (ref 3.5–5.0)
ALT: 26 U/L (ref 0–44)
AST: 29 U/L (ref 15–41)
Alkaline Phosphatase: 61 U/L (ref 38–126)
Anion gap: 13 (ref 5–15)
BILIRUBIN TOTAL: 0.5 mg/dL (ref 0.3–1.2)
BUN: 17 mg/dL (ref 6–20)
CO2: 24 mmol/L (ref 22–32)
Calcium: 9.8 mg/dL (ref 8.9–10.3)
Chloride: 102 mmol/L (ref 98–111)
Creatinine, Ser: 0.73 mg/dL (ref 0.44–1.00)
GFR calc Af Amer: >60 mL/min (ref >60)
GFR calc non Af Amer: >60 mL/min (ref >60)
Glucose, Bld: 113 mg/dL — ABNORMAL HIGH (ref 70–99)
POTASSIUM: 4.2 mmol/L (ref 3.5–5.1)
Sodium: 139 mmol/L (ref 135–145)
TOTAL PROTEIN: 7.6 g/dL (ref 6.5–8.1)

## 2018-11-14 LAB — I-STAT BETA HCG BLOOD, ED (MC, WL, AP ONLY): I-stat hCG, quantitative: <5 m[IU]/mL (ref <5)

## 2018-11-14 MED ORDER — HYDROCODONE-ACETAMINOPHEN 5-325 MG PO TABS: 1.0000 | ORAL_TABLET | Freq: Four times a day (QID) | ORAL | 0 refills | Status: DC | PRN

## 2018-11-14 MED ORDER — MORPHINE SULFATE (PF) 4 MG/ML IV SOLN
4.0000 mg | Freq: Once | INTRAVENOUS | Status: AC
  Administered 2018-11-14: 4 mg via INTRAVENOUS
  Filled 2018-11-14: qty 1

## 2018-11-14 MED ORDER — PREDNISONE 20 MG PO TABS: ORAL_TABLET | ORAL | 0 refills | Status: DC

## 2018-11-14 MED ORDER — DIAZEPAM 5 MG/ML IJ SOLN
5.0000 mg | Freq: Once | INTRAMUSCULAR | Status: AC
  Administered 2018-11-14: 5 mg via INTRAVENOUS
  Filled 2018-11-14: qty 2

## 2018-11-14 MED ORDER — IOHEXOL 300 MG/ML  SOLN
100.0000 mL | Freq: Once | INTRAMUSCULAR | Status: AC | PRN
  Administered 2018-11-14: 100 mL via INTRAVENOUS

## 2018-11-14 MED ORDER — KETOROLAC TROMETHAMINE 30 MG/ML IJ SOLN
30.0000 mg | Freq: Once | INTRAMUSCULAR | Status: AC
  Administered 2018-11-14: 30 mg via INTRAVENOUS
  Filled 2018-11-14: qty 1

## 2018-11-14 MED ORDER — KETOROLAC TROMETHAMINE 15 MG/ML IJ SOLN: 30.0000 mg | Freq: Once | INTRAMUSCULAR | Status: DC

## 2018-11-14 MED ORDER — CYCLOBENZAPRINE HCL 5 MG PO TABS: 5.0000 mg | ORAL_TABLET | Freq: Three times a day (TID) | ORAL | 0 refills | Status: DC | PRN

## 2018-11-14 MED ORDER — PREDNISONE 20 MG PO TABS
60.0000 mg | ORAL_TABLET | Freq: Once | ORAL | Status: AC
  Administered 2018-11-14: 60 mg via ORAL
  Filled 2018-11-14: qty 3

## 2018-11-14 MED ORDER — SODIUM CHLORIDE 0.9 % IV BOLUS
1000.0000 mL | Freq: Once | INTRAVENOUS | Status: AC
  Administered 2018-11-14: 1000 mL via INTRAVENOUS

## 2018-11-14 NOTE — ED Provider Notes (Signed)
[facility] EMERGENCY DEPARTMENT Provider Note   CSN: [id number] Arrival date & time: 11/14/18  1258     History   Chief Complaint Chief Complaint  Patient presents with  . Back Pain    HPI Madeline Chang is a 52 y.o. female.with PMHx of DM type 2 sith neuropathy, and left 2 toes amputation, chronic low back pain, HTN, presented with worsening of low back pain since past week.  She reports deep sharp pain on her lower back when standing for 1-2 minutes. The pain is better when she sits or lay back. Intensity between 5 to 8 out of 10 since last week,  She took Norco/Vicodinm BID and it temporarily helped. No Hx of trauma or heavy lifting. No fever or chills. No new numbness or tingling.No weakness. No urinary or fecal incontinency. Reports Vit D deficiency. She mentions that she saw her doctor yesterday, planned Imaging and orthopedic referral. How ever, she came to the ED due to sever pain. She also reports some lower abdominal discomfort since yesterday without any changes in her urine. She report Hx of chronic UTI.   HPI  Past Medical History:  Diagnosis Date  . Diabetes mellitus without complication ([facility])   . Hypertension   . Lipid disorder   . Peripheral neuropathy     There are no active problems to display for this patient.   Past Surgical History:  Procedure Laterality Date  . amputation 2 toes    . CESAREAN SECTION       OB History   None      Home Medications    Prior to Admission medications   Medication Sig Start Date End Date Taking? Authorizing Provider  aspirin 81 MG tablet Take 81 mg by mouth daily.    [provider]  atorvastatin (LIPITOR) 20 MG tablet Take 20 mg by mouth every morning. 05/09/17   [provider]  cyclobenzaprine (FLEXERIL) 5 MG tablet Take 1 tablet (5 mg total) by mouth 3 (three) times daily as needed. 11/14/18   Charlynne Pander, MD  gabapentin (NEURONTIN) 100 MG capsule Take 100 mg by mouth 3  (three) times daily.    [provider]  HYDROcodone-acetaminophen (NORCO/VICODIN) 5-325 MG tablet Take 1 tablet by mouth every 6 (six) hours as needed. 11/14/18   Charlynne Pander, MD  INVOKANA 300 MG TABS tablet Take 300 mg by mouth daily. 06/12/17   [provider]  JANUMET 50-1000 MG tablet Take 1 tablet by mouth 2 (two) times daily. 05/08/17   [provider]  LEVEMIR FLEXTOUCH 100 UNIT/ML Pen Inject 98 Units into the skin daily before breakfast. 06/21/17   [provider]  losartan (COZAAR) 50 MG tablet Take 50 mg by mouth daily. 06/06/17   [provider]  metFORMIN (GLUCOPHAGE) 1000 MG tablet Take 1,000 mg by mouth See admin instructions. TWO TIMES A DAY IN LIEU OF JANUMET ONLY IF A SULFA DRUG HAS BEEN TEMPORARILY PRESCRIBED 06/06/17   [provider]  Multiple Vitamins-Minerals (CENTRUM PO) Take 1 tablet by mouth daily.    [provider]  naproxen (NAPROSYN) 500 MG tablet Take 500 mg by mouth 2 (two) times daily. 05/10/17   [provider]  predniSONE (DELTASONE) 20 MG tablet Take 60 mg daily x 2 days then 40 mg daily x 2 days then 20 mg daily x 2 days 11/14/18   Charlynne Pander, MD    Family History Family History  Problem Relation Age of  Onset  . Diabetes Mother   . Diabetes Father   . Diabetes Sister     Social History Social History   Tobacco Use  . Smoking status: Never Smoker  . Smokeless tobacco: Never Used  Substance Use Topics  . Alcohol use: No  . Drug use: No     Allergies   Patient has no known allergies.   Review of Systems Review of Systems  Constitutional: Negative for chills and fever.  Respiratory: Negative for cough and shortness of breath.   Cardiovascular: Negative for chest pain.  Musculoskeletal: Positive for back pain. Negative for gait problem.  Neurological: Negative.      Physical Exam Updated Vital Signs BP 118/61   Pulse 75   Temp 98.6 F (37 C) (Oral)   Resp  17   SpO2 97%   Physical Exam  Constitutional: She is oriented to person, place, and time. She appears well-developed and well-nourished. No distress.  HENT:  Head: Normocephalic and atraumatic.  Eyes: Pupils are equal, round, and reactive to light. EOM are normal.  Cardiovascular: Normal rate, regular rhythm and normal heart sounds.  No murmur heard. Pulmonary/Chest: Effort normal and breath sounds normal. No respiratory distress. She has no wheezes. She has no rales.  Abdominal: Soft. Bowel sounds are normal. She exhibits no distension. There is tenderness on lower abdomen mostly at left side. Musculoskeletal: Normal range of motion.No local tenderness on spines.  Left 2 toes amputation Neurological: She is alert and oriented to person, place, and time. A (chronic) sensory deficit is present at left lower extremity. No cranial nerve deficit. She exhibits normal muscle tone. Coordination normal.  Skin: Skin is warm and dry.  Psychiatric: She has a normal mood and affect.    ED Treatments / Results  Labs (all labs ordered are listed, but only abnormal results are displayed) Labs Reviewed  URINE CULTURE - Abnormal; Notable for the following components:      Result Value   Culture 80,000 COLONIES/mL VIRIDANS STREPTOCOCCUS (*)    All other components within normal limits  URINALYSIS, ROUTINE W REFLEX MICROSCOPIC - Abnormal; Notable for the following components:   Color, Urine STRAW (*)    Glucose, UA >=500 (*)    All other components within normal limits  CBC WITH DIFFERENTIAL/PLATELET - Abnormal; Notable for the following components:   RBC 5.41 (*)    MCV 78.0 (*)    MCH 22.6 (*)    MCHC 28.9 (*)    RDW 15.8 (*)    Monocytes Absolute 1.1 (*)    Abs Immature Granulocytes 0.11 (*)    All other components within normal limits  COMPREHENSIVE METABOLIC PANEL - Abnormal; Notable for the following components:   Glucose, Bld 113 (*)    All other components within normal limits  I-STAT  BETA HCG BLOOD, ED ([facility], [facility], [facility] ONLY)    EKG None  Radiology No results found.  Procedures Procedures (including critical care time)  Medications Ordered in ED Medications  ketorolac (TORADOL) 30 MG/ML injection 30 mg (30 mg Intravenous Given 11/14/18 1617)  sodium chloride 0.9 % bolus 1,000 mL (0 mLs Intravenous Stopped 11/14/18 1733)  morphine 4 MG/ML injection 4 mg (4 mg Intravenous Given 11/14/18 1734)  diazepam (VALIUM) injection 5 mg (5 mg Intravenous Given 11/14/18 1737)  iohexol (OMNIPAQUE) 300 MG/ML solution 100 mL (100 mLs Intravenous Contrast Given 11/14/18 1820)  predniSONE (DELTASONE) tablet 60 mg (60 mg Oral Given 11/14/18 1945)     Initial Impression /  Assessment and Plan / ED Course  I have reviewed the triage vital signs and the nursing notes.  Pertinent labs & imaging results that were available during my care of the patient were reviewed by me and considered in my medical decision making (see chart for details).   Patient presented with acute on chronic low back pain. Pain is aggravated by long standing. Is improved with resting. No Hx of trauma. No Hx of fever, new neurological deficit. No evidence of cauda equina syndrome And no red flags for infection on history and physical exam no local tenderness, however can still consider minor fracture having history of vitamin D deficiency.  Also reports hx of UTI and had abdominal pain since yesterday, mild tenderness on low    -U/A and U/C -CBC with diff -IV Toradol 30 mg  -Non contrast Ct scan with CT lumbar read -CMP   Update: No UTI on U.A. No leukocytosis on CBC.  CT scan with contrast showed: Left subarticular disc protrusion at L5-S1 extending into the  left lateral recess, potentially affecting the descending left S1 nerve root. 3. Disc bulging with facet hypertrophy elsewhere within the lumbar spine with resultant multilevel foraminal narrowing as above. Notable findings include mild to moderate left L3  and bilateral L4 foraminal stenosis, with severe right and moderate left L5 foraminal Narrowing. Results was discussed with patient by Dr. Silverio LayYao.  Planned to discharge with pain medication, Flexeril, Prednison and follow up with orthopedic as already planned by PCP  Final Clinical Impressions(s) / ED Diagnoses   Final diagnoses:  Back pain  Lumbar radiculopathy  Bulging lumbar disc    ED Discharge Orders         Ordered    predniSONE (DELTASONE) 20 MG tablet     11/14/18 1947    HYDROcodone-acetaminophen (NORCO/VICODIN) 5-325 MG tablet  Every 6 hours PRN     11/14/18 1947    cyclobenzaprine (FLEXERIL) 5 MG tablet  3 times daily PRN     11/14/18 1947           Chevis PrettyMasoudi, Shawni Volkov, MD 11/17/18 1356    Charlynne PanderYao, David Hsienta, MD 11/19/18 2017

## 2018-11-14 NOTE — Discharge Instructions (Signed)
Take prednisone as prescribed to help with your pinched nerve from slipped disc in your back   Take norco for pain   Take flexeril for muscle spasms.   See your doctor.   Consider following up with spine doctor   Return to ER if you have worse back pain, leg pain, weakness, numbness

## 2018-11-14 NOTE — ED Triage Notes (Signed)
Pt in c/o lower back pain that has been going on for the last month, also radiates around to her abdomen, denies n/v, no distress noted

## 2018-11-14 NOTE — ED Notes (Signed)
Pt ambulated to restroom with steady gait.

## 2018-11-14 NOTE — ED Notes (Signed)
ED Provider at bedside. 

## 2018-11-15 LAB — URINE CULTURE: Culture: 80000 — AB

## 2018-11-16 ENCOUNTER — Telehealth: Payer: Self-pay | Admitting: *Deleted

## 2018-11-16 NOTE — Telephone Encounter (Signed)
Post ED Visit - Positive Culture Follow-up  Culture report reviewed by antimicrobial stewardship pharmacist:  []  Madeline Chang, Pharm.D. []  Madeline Chang, Pharm.D., BCPS AQ-ID []  Madeline Chang, Pharm.D., BCPS []  Madeline Chang, [address] BCPS []  Madeline Chang, [address] BCPS, AAHIVP []  Madeline Chang, Pharm.D., BCPS, AAHIVP []  Madeline Chang, PharmD, BCPS []  Madeline Chang, PharmD, BCPS [x]  Madeline Chang, PharmD, BCPS []  Madeline Chang, PharmD  Positive urine culture Urinalysis negative and no further patient follow-up is required at this time.  Madeline Chang, Madeline Chang [facility] 11/16/2018, 8:42 AM

## 2019-02-14 ENCOUNTER — Ambulatory Visit: Payer: BLUE CROSS/BLUE SHIELD | Admitting: Nurse Practitioner

## 2020-08-31 IMAGING — CT CT ABD-PELV W/ CM
2 of 5 series · 17 of 46 positions shown, 19 images · IV contrast (omnipaque)
Comparison: 09/25/2014

CLINICAL DATA: Abdominal distension

EXAM:
CT ABDOMEN AND PELVIS WITH CONTRAST
TECHNIQUE: Multidetector CT imaging of the abdomen and pelvis was performed
using the standard protocol following bolus administration of
intravenous contrast.
CONTRAST:  100mL OMNIPAQUE IOHEXOL 300 MG/ML  SOLN

[Series 3: abd/ pelvis 5.0 i30f 2 · axial · 0.98mm/px · z∈[+861,+1286]mm · 14 of 95 slices shown, 16 images]
[im 5/95  soft-tissue]
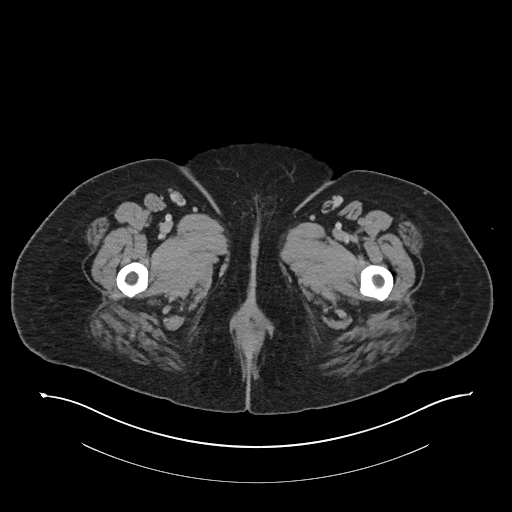
[im 5/95  bone]
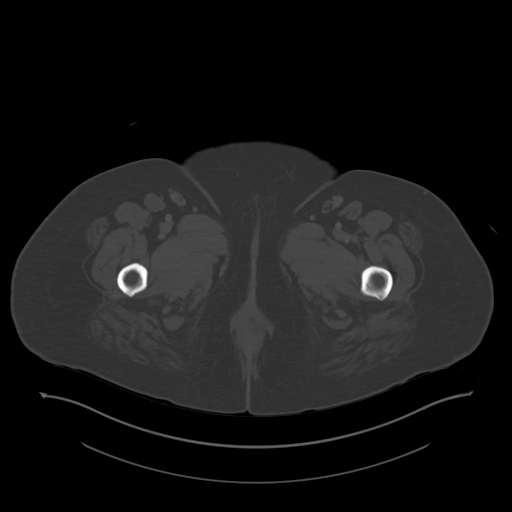
[im 14/95  soft-tissue]
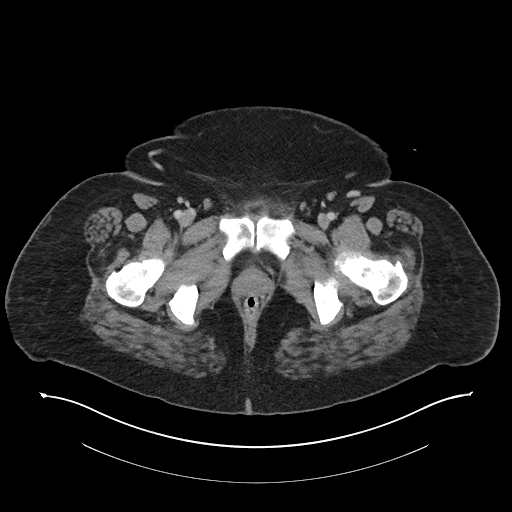
[im 18/95  soft-tissue]
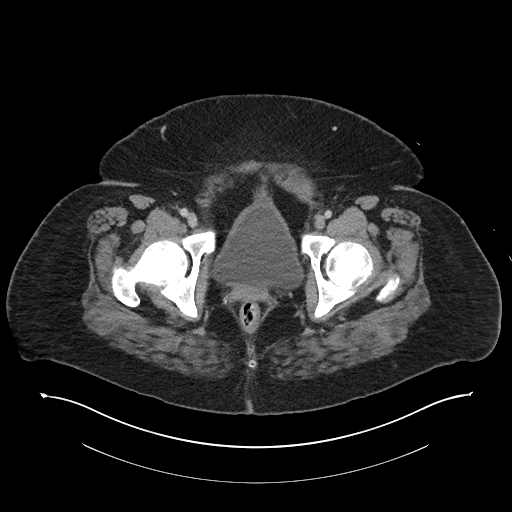
[im 27/95  soft-tissue]
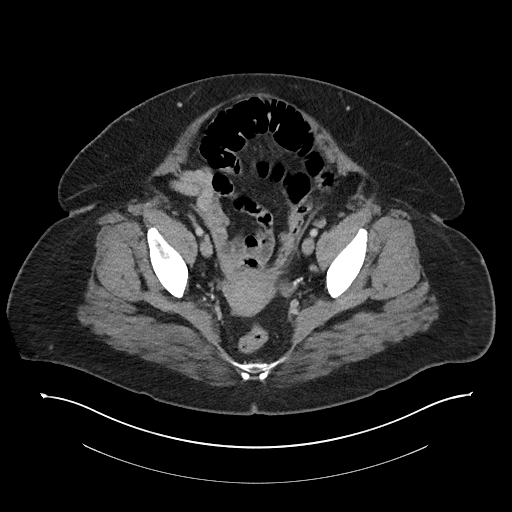
[im 32/95  soft-tissue]
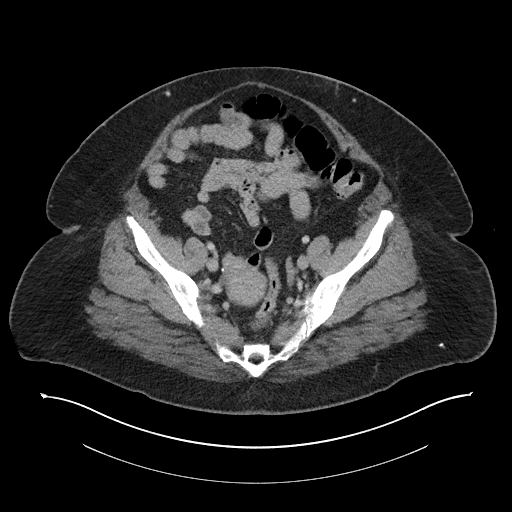
[im 36/95  soft-tissue]
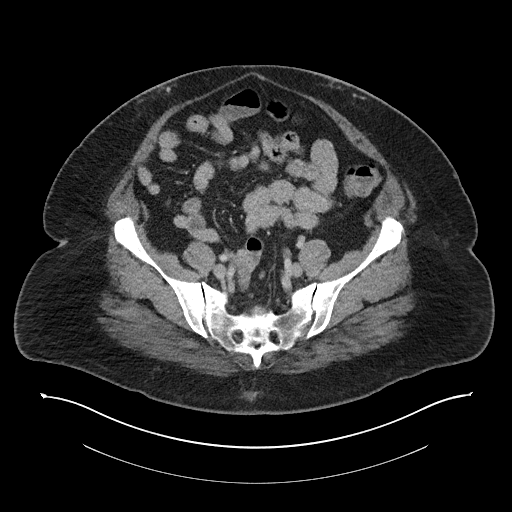
[im 45/95  soft-tissue]
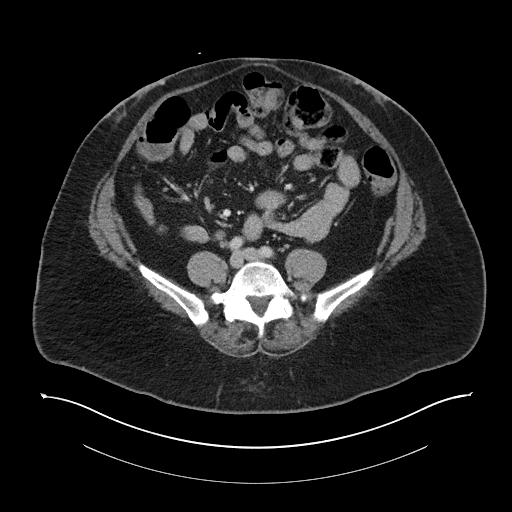
[im 50/95  soft-tissue]
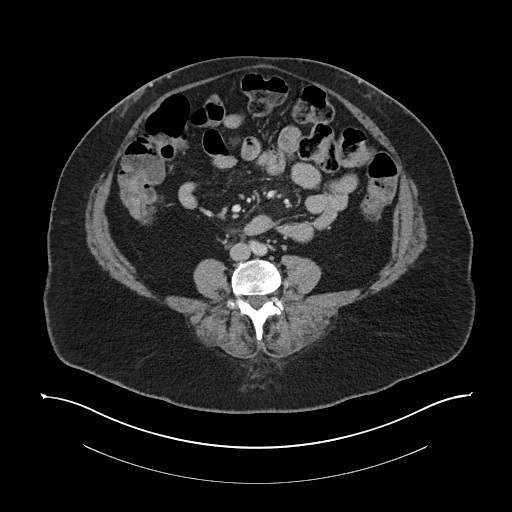
[im 59/95  soft-tissue]
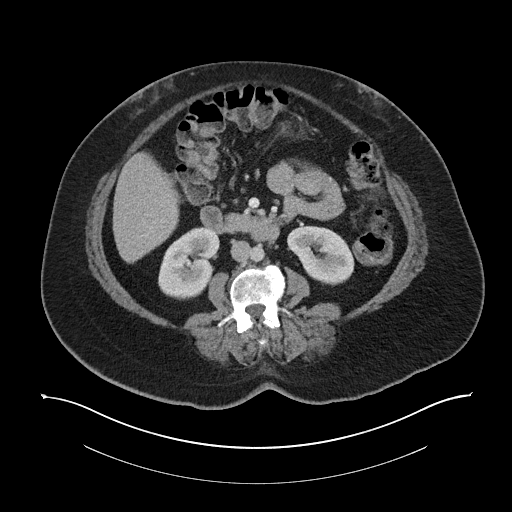
[im 59/95  bone]
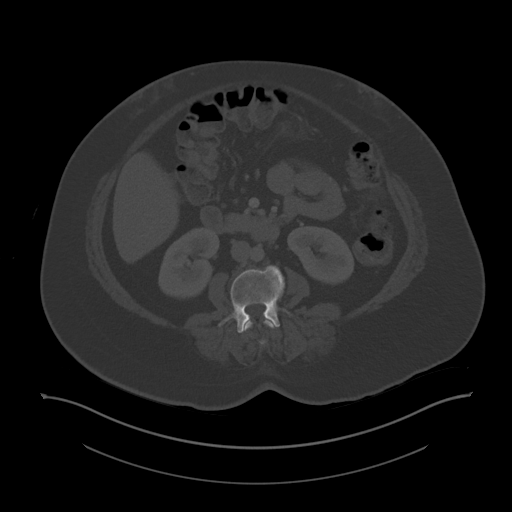
[im 63/95  soft-tissue]
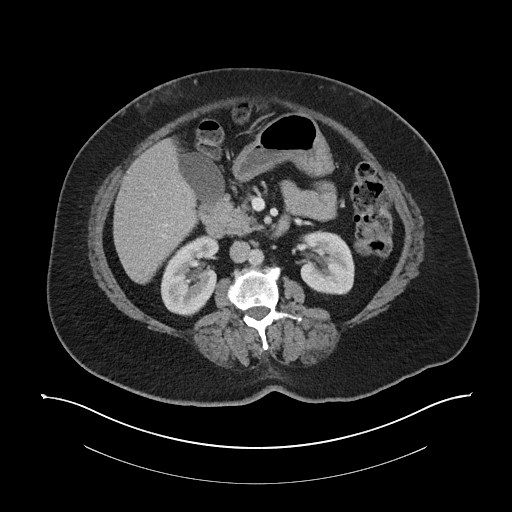
[im 72/95  soft-tissue]
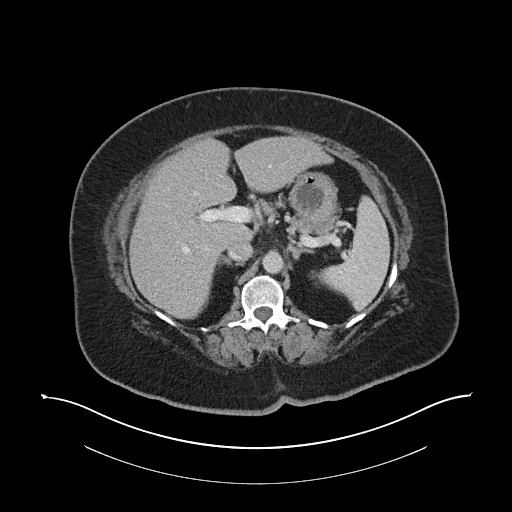
[im 77/95  soft-tissue]
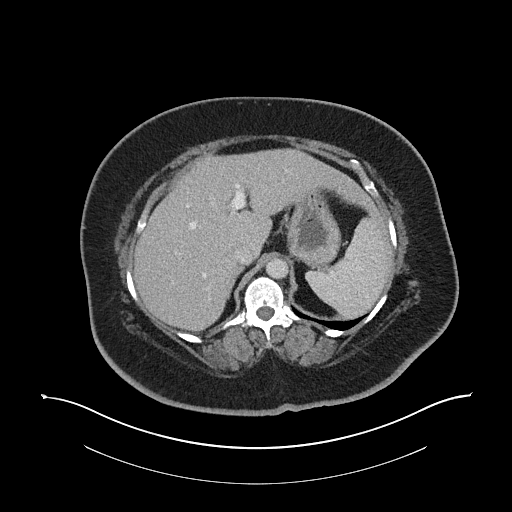
[im 81/95  soft-tissue]
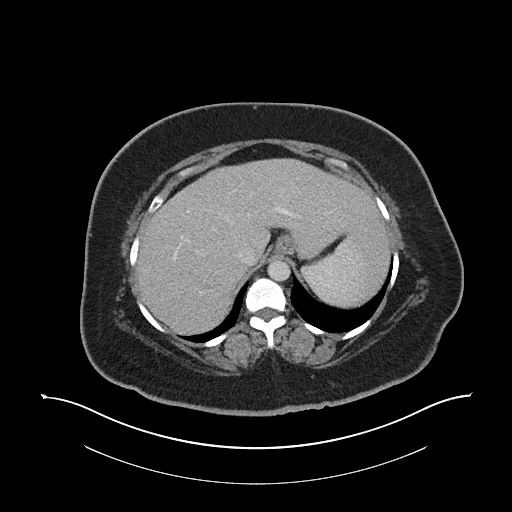
[im 90/95  soft-tissue]
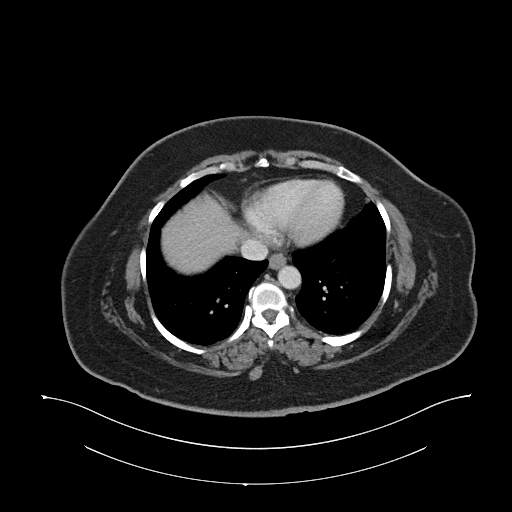

[Series 6: coronal soft tissue · coronal · 0.92mm/px · 3 of 120 slices shown]
[im 40/120  soft-tissue]
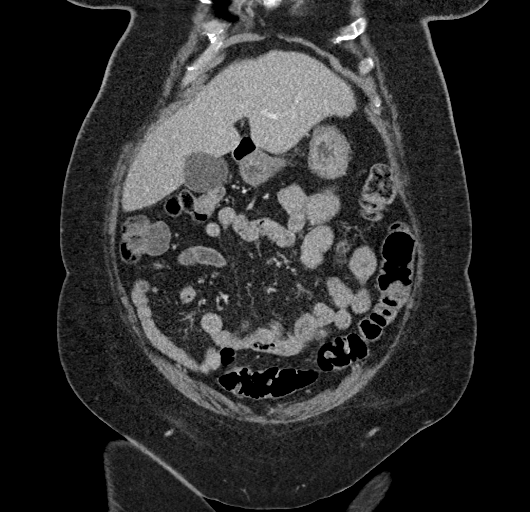
[im 53/120  soft-tissue]
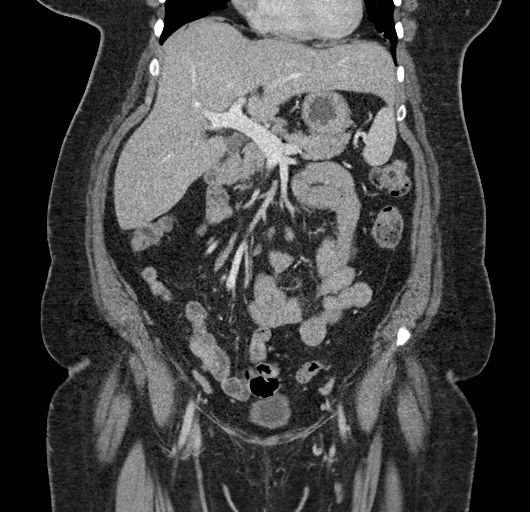
[im 67/120  soft-tissue]
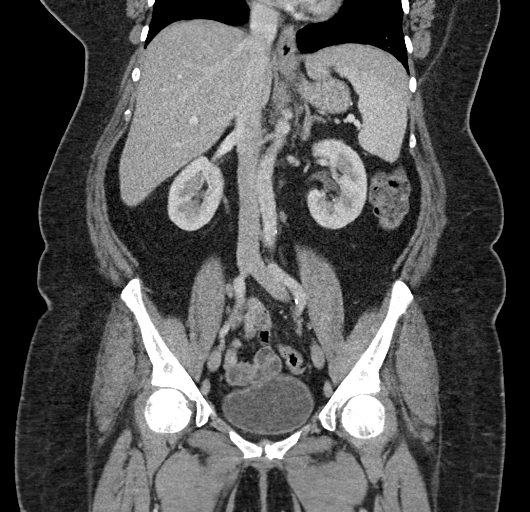

[17 of 46 positions shown; findings below may reference images not displayed]

FINDINGS: Lower chest: Lung bases are clear. No effusions. Heart is normal
size.

Hepatobiliary: No focal hepatic abnormality. Gallbladder
unremarkable.

Pancreas: No focal abnormality or ductal dilatation.

Spleen: No focal abnormality.  Normal size.

Adrenals/Urinary Tract: No adrenal abnormality. No focal renal
abnormality. No stones or hydronephrosis. Urinary bladder is
unremarkable.

Stomach/Bowel: Normal appendix. Stomach, large and small bowel
grossly unremarkable.

Vascular/Lymphatic: No evidence of aneurysm or adenopathy.

Reproductive: Uterus and adnexa unremarkable.  No mass.

Other: No free fluid or free air.

Musculoskeletal: No acute bony abnormality. Degenerative changes in
the lumbar spine.
IMPRESSION: No acute findings in the abdomen or pelvis.

## 2020-08-31 IMAGING — CT CT L SPINE W/O CM
4 series · 15 of 33 positions shown, 18 images · non-contrast
Comparison: None.

CLINICAL DATA: Initial evaluation for acute lower back pain for 1
month.

EXAM:
CT LUMBAR SPINE WITHOUT CONTRAST
TECHNIQUE: Multidetector CT imaging of the lumbar spine was performed without
intravenous contrast administration. Multiplanar CT image
reconstructions were also generated.

[Series 503: axial bone · axial · 0.51mm/px · z∈[+1033,+1179]mm · 5 of 110 slices shown, 7 images]
[im 19/110  soft-tissue]
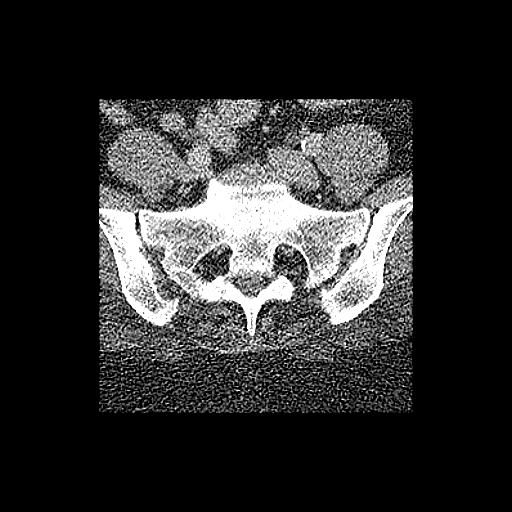
[im 19/110  bone]
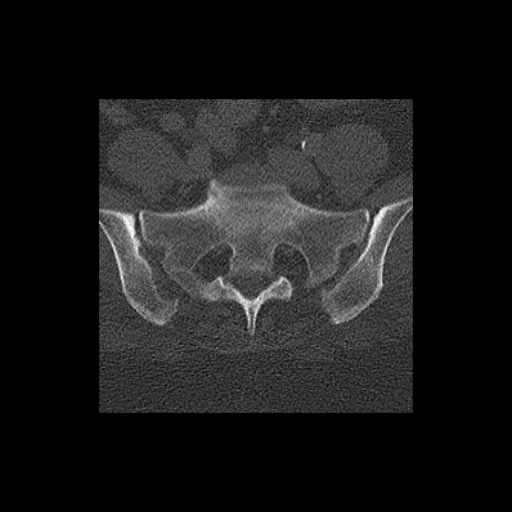
[im 37/110  bone]
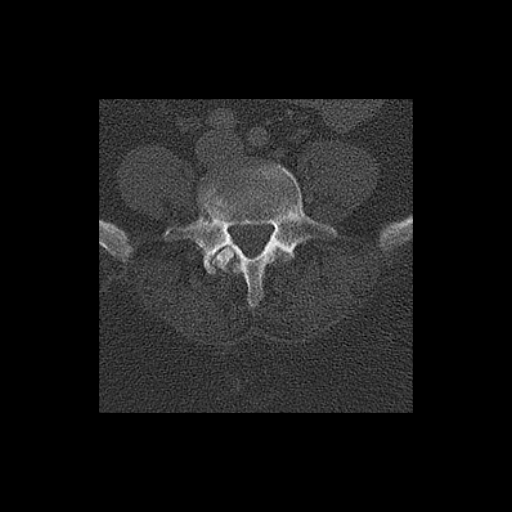
[im 55/110  bone]
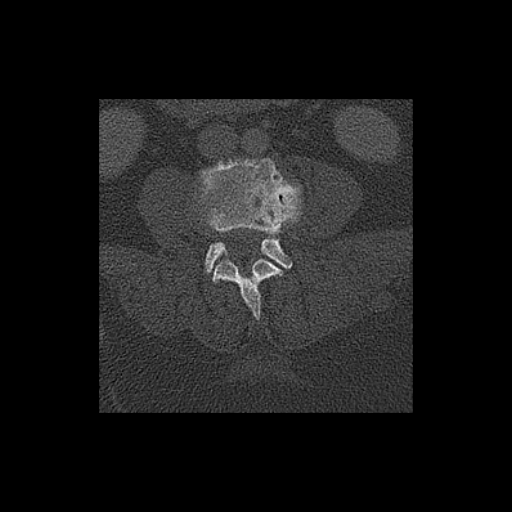
[im 73/110  bone]
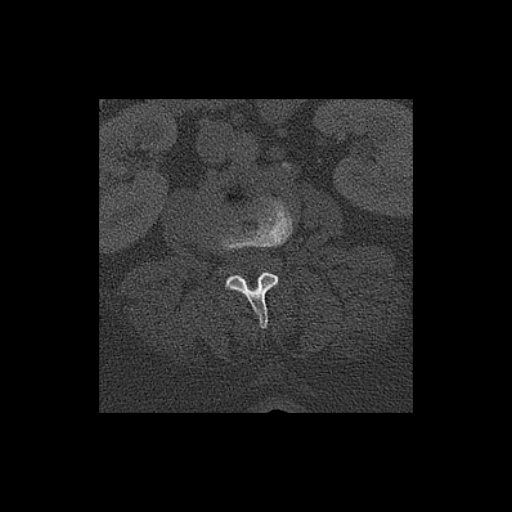
[im 91/110  soft-tissue]
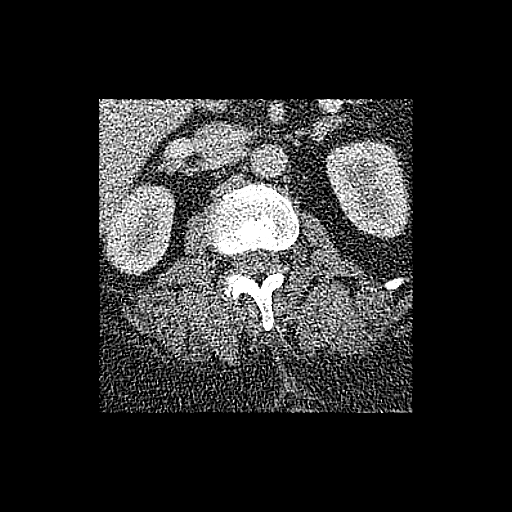
[im 91/110  bone]
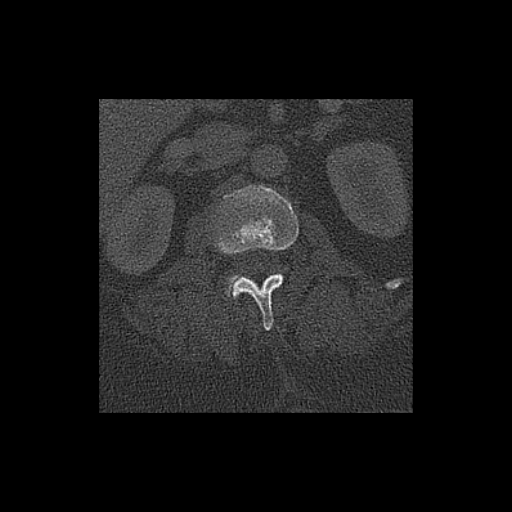

[Series 504: coronal bone · coronal · 0.51mm/px · 3 of 79 slices shown]
[im 16/79  bone]
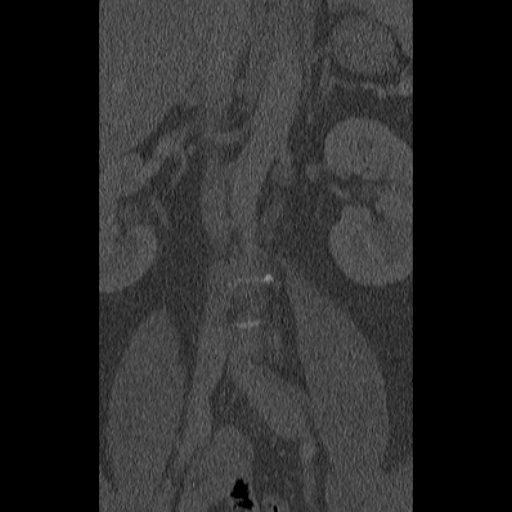
[im 32/79  bone]
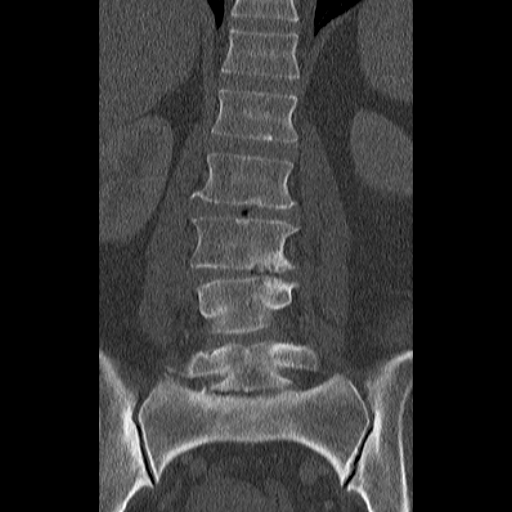
[im 47/79  bone]
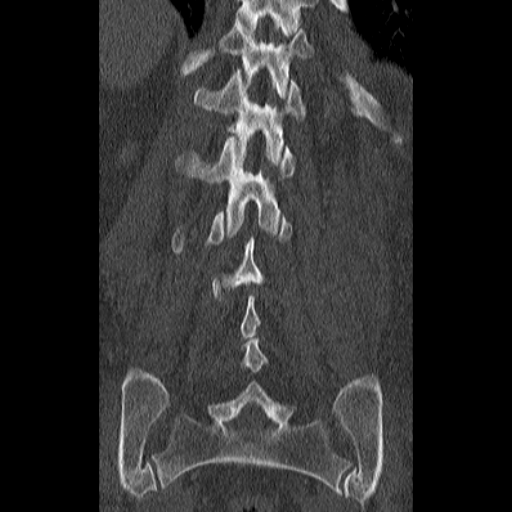

[Series 505: sagittal bone · sagittal · 0.51mm/px · 5 of 80 slices shown, 6 images]
[im 27/80  bone]
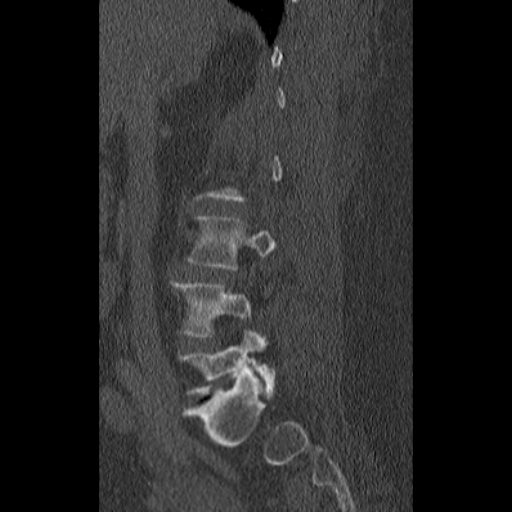
[im 33/80  bone]
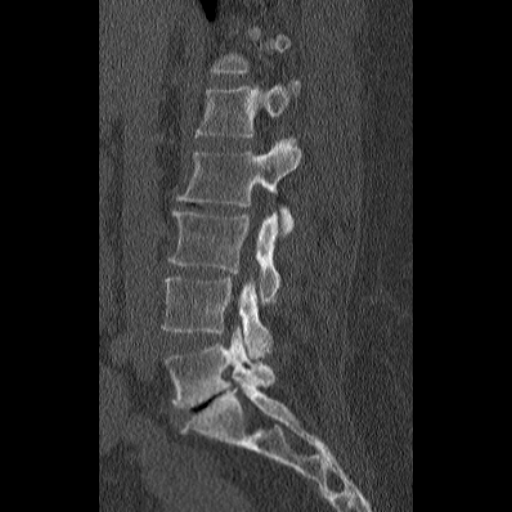
[im 40/80  soft-tissue]
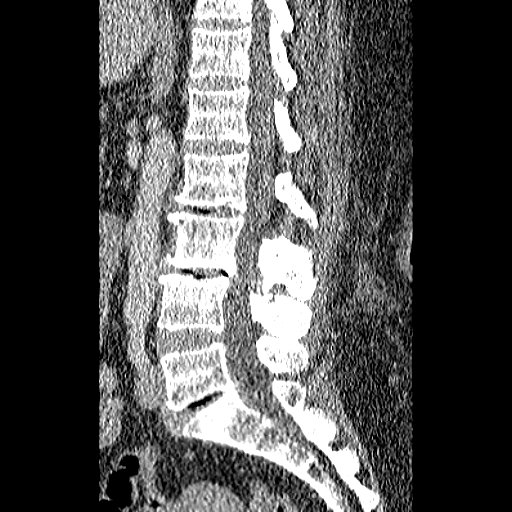
[im 40/80  bone]
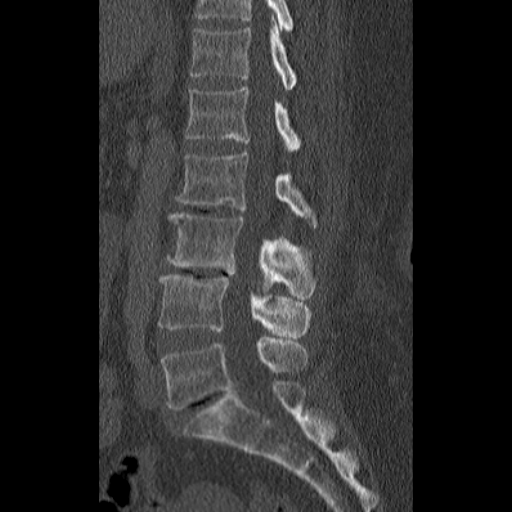
[im 47/80  bone]
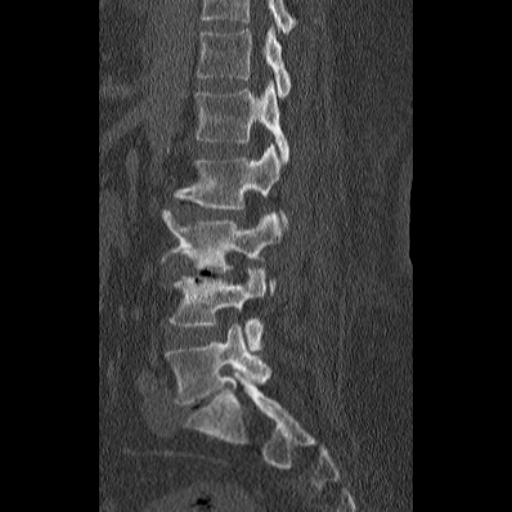
[im 53/80  bone]
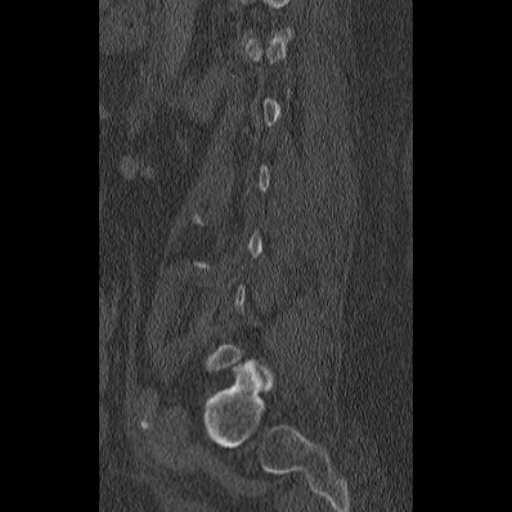

[Series 506: soft tissue axial · axial · 0.51mm/px · z∈[+1037,+1077]mm · 2 of 120 slices shown]
[im 20/120  soft-tissue]
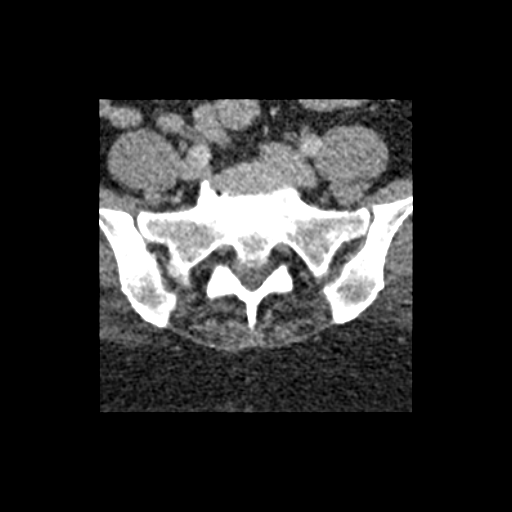
[im 40/120  soft-tissue]
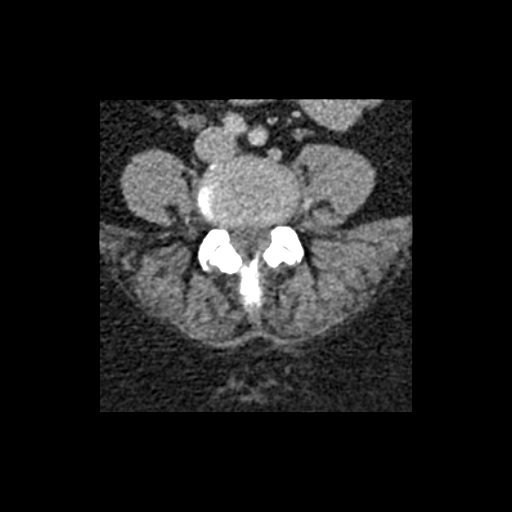

[15 of 33 positions shown; findings below may reference images not displayed]

FINDINGS: Segmentation: Standard.

Alignment: Mild dextroscoliosis with apex at L3-4. Associated
straightening with mild reversal of the normal lumbar lordosis.
Trace 2 mm retrolisthesis of L3 on L4, likely chronic and
degenerative.

Vertebrae: Vertebral body heights maintained without evidence for
acute or chronic fracture. Visualized sacrum and pelvis intact. SI
joints approximated symmetric. No discrete osseous lesions.

Paraspinal and other soft tissues: Paraspinous soft tissues
demonstrate no acute finding. Scattered atherosclerotic change noted
within the visualized aorta. Remainder of the visualized visceral
structures otherwise unremarkable.

Disc levels:

L1-2:  Mild facet hypertrophy.  No stenosis.

L2-3: Chronic intervertebral disc space narrowing with disc
desiccation and diffuse disc bulge. Prominent left anterolateral
endplate spurring. No significant spinal stenosis. Foramina remain
patent.

L3-4: Trace retrolisthesis. Chronic intervertebral disc space
narrowing with diffuse disc bulge and disc desiccation. Left-sided
reactive endplate changes with endplate osteophytic spurring. Mild
facet hypertrophy. No significant spinal stenosis. Mild to moderate
left with mild right L3 foraminal narrowing.

L4-5: Mild annular disc bulge. Mild to moderate facet hypertrophy,
greater on the right. Central canal remains patent. Mild to moderate
right greater than left L4 foraminal narrowing.

L5-S1: Chronic intervertebral disc space narrowing with diffuse disc
bulge and disc desiccation. Associated reactive endplate changes.
Moderate facet hypertrophy. Superimposed left subarticular disc
protrusion extends into the left lateral recess, potentially
affecting the descending left S1 nerve root. Central canal remains
patent. Severe right with moderate left L5 foraminal narrowing
related to disc osteophyte and facet hypertrophy.
IMPRESSION: 1. No acute abnormality within the lumbar spine.
2. Left subarticular disc protrusion at L5-S1 extending into the
left lateral recess, potentially affecting the descending left S1
nerve root.
3. Disc bulging with facet hypertrophy elsewhere within the lumbar
spine with resultant multilevel foraminal narrowing as above.
Notable findings include mild to moderate left L3 and bilateral L4
foraminal stenosis, with severe right and moderate left L5 foraminal
narrowing.

## 2021-10-20 ENCOUNTER — Other Ambulatory Visit (HOSPITAL_BASED_OUTPATIENT_CLINIC_OR_DEPARTMENT_OTHER): Payer: Self-pay

## 2021-10-20 MED ORDER — OZEMPIC (2 MG/DOSE) 8 MG/3ML ~~LOC~~ SOPN
PEN_INJECTOR | SUBCUTANEOUS | 3 refills | Status: DC
  Filled 2021-10-20: qty 3, 28d supply, fill #0

## 2022-01-12 ENCOUNTER — Other Ambulatory Visit (HOSPITAL_BASED_OUTPATIENT_CLINIC_OR_DEPARTMENT_OTHER): Payer: Self-pay

## 2022-01-12 MED ORDER — OZEMPIC (2 MG/DOSE) 8 MG/3ML ~~LOC~~ SOPN
PEN_INJECTOR | SUBCUTANEOUS | 0 refills | Status: DC
  Filled 2022-01-12 – 2022-01-19 (×3): qty 3, 28d supply, fill #0

## 2022-01-13 ENCOUNTER — Other Ambulatory Visit (HOSPITAL_BASED_OUTPATIENT_CLINIC_OR_DEPARTMENT_OTHER): Payer: Self-pay

## 2022-01-14 ENCOUNTER — Other Ambulatory Visit (HOSPITAL_BASED_OUTPATIENT_CLINIC_OR_DEPARTMENT_OTHER): Payer: Self-pay

## 2022-01-17 ENCOUNTER — Other Ambulatory Visit (HOSPITAL_BASED_OUTPATIENT_CLINIC_OR_DEPARTMENT_OTHER): Payer: Self-pay

## 2022-01-18 ENCOUNTER — Other Ambulatory Visit (HOSPITAL_BASED_OUTPATIENT_CLINIC_OR_DEPARTMENT_OTHER): Payer: Self-pay

## 2022-01-19 ENCOUNTER — Other Ambulatory Visit (HOSPITAL_BASED_OUTPATIENT_CLINIC_OR_DEPARTMENT_OTHER): Payer: Self-pay

## 2022-01-20 ENCOUNTER — Other Ambulatory Visit (HOSPITAL_BASED_OUTPATIENT_CLINIC_OR_DEPARTMENT_OTHER): Payer: Self-pay

## 2022-01-21 ENCOUNTER — Other Ambulatory Visit (HOSPITAL_BASED_OUTPATIENT_CLINIC_OR_DEPARTMENT_OTHER): Payer: Self-pay

## 2022-01-21 MED ORDER — OZEMPIC (1 MG/DOSE) 4 MG/3ML ~~LOC~~ SOPN
PEN_INJECTOR | SUBCUTANEOUS | 0 refills | Status: DC
  Filled 2022-01-21: qty 3, 30d supply, fill #0

## 2022-03-01 ENCOUNTER — Other Ambulatory Visit (HOSPITAL_BASED_OUTPATIENT_CLINIC_OR_DEPARTMENT_OTHER): Payer: Self-pay

## 2022-10-14 ENCOUNTER — Ambulatory Visit
Admission: EM | Admit: 2022-10-14 | Discharge: 2022-10-14 | Disposition: A | Discharge status: Home / Self Care | Payer: Medicaid Other | Attending: Emergency Medicine | Admitting: Emergency Medicine

## 2022-10-14 DIAGNOSIS — Z794 Long term (current) use of insulin: Secondary | ICD-10-CM | POA: Diagnosis present

## 2022-10-14 DIAGNOSIS — G8929 Other chronic pain: Secondary | ICD-10-CM | POA: Diagnosis not present

## 2022-10-14 DIAGNOSIS — M545 Low back pain, unspecified: Secondary | ICD-10-CM | POA: Diagnosis not present

## 2022-10-14 DIAGNOSIS — N760 Acute vaginitis: Secondary | ICD-10-CM | POA: Insufficient documentation

## 2022-10-14 DIAGNOSIS — E119 Type 2 diabetes mellitus without complications: Secondary | ICD-10-CM | POA: Insufficient documentation

## 2022-10-14 DIAGNOSIS — E1165 Type 2 diabetes mellitus with hyperglycemia: Secondary | ICD-10-CM | POA: Diagnosis not present

## 2022-10-14 DIAGNOSIS — R81 Glycosuria: Secondary | ICD-10-CM | POA: Insufficient documentation

## 2022-10-14 LAB — POCT URINALYSIS DIP (MANUAL ENTRY)
Bilirubin, UA: NEGATIVE
Blood, UA: NEGATIVE
Glucose, UA: >=1000 mg/dL — AB
Ketones, POC UA: NEGATIVE mg/dL
Leukocytes, UA: NEGATIVE
Nitrite, UA: NEGATIVE
Protein Ur, POC: NEGATIVE mg/dL
Spec Grav, UA: 1.015 (ref 1.010–1.025)
Urobilinogen, UA: 0.2 E.U./dL
pH, UA: 7 (ref 5.0–8.0)

## 2022-10-14 LAB — POCT FASTING CBG KUC MANUAL ENTRY: POCT Glucose (KUC): 180 mg/dL — AB (ref 70–99)

## 2022-10-14 MED ORDER — TERCONAZOLE 0.4 % VA CREA: TOPICAL_CREAM | VAGINAL | 0 refills | Status: AC

## 2022-10-14 NOTE — ED Provider Notes (Signed)
[facility]    CSN: [id number] Arrival date & time: 10/14/22  1805    HISTORY   Chief Complaint  Patient presents with   Abdominal Pain   Urinary Frequency   Back Pain   HPI Madeline Chang is a pleasant, 56 y.o. female who presents to urgent care today. Patient complains of increased frequency of urination, burning with urination, abdominal pain and lower back pain.  Patient has a history of poorly controlled insulin-dependent type 2 diabetes, currently taking Mounjaro, Januvia, Toujeo and Novolog, last A1c was 8.1.   EMR reviewed.  Patient had CT scan of the lumbar spine 2019 which revealed mild dextroscoliosis with apex at L3-4. Associated  straightening with mild reversal of the normal lumbar lordosis, trace 2 mm retrolisthesis of L3 on L4, likely chronic and degenerative.  Patient also has a history of chronic idiopathic constipation, states she has been taking MiraLAX as needed and is not having trouble with constipation at this time.  Urine dip today revealed >1000 mg/dL of glucose in her urine without ketones, no other abnormal findings.  Patient states her primary care provider normally gives her Bactrim and fluconazole when she has the symptoms.  The history is provided by the patient.   Past Medical History:  Diagnosis Date   Diabetes mellitus without complication ([place])    Hypertension    Lipid disorder    Peripheral neuropathy    There are no problems to display for this patient.  Past Surgical History:  Procedure Laterality Date   amputation 2 toes     CESAREAN SECTION     OB History   No obstetric history on file.    Home Medications    Prior to Admission medications   Medication Sig Start Date End Date Taking? Authorizing Provider  empagliflozin (JARDIANCE) 25 MG TABS tablet Take 25 mg by mouth daily.   Yes [provider]  escitalopram (LEXAPRO) 10 MG tablet Take 10 mg by mouth daily.   Yes [provider]  insulin aspart (NOVOLOG  FLEXPEN) 100 UNIT/ML FlexPen Inject 7-10 Units into the skin 3 (three) times daily with meals. Inject 10 units with breakfast, 7 units with dinner   Yes [provider]  insulin glargine, 2 Unit Dial, (TOUJEO MAX SOLOSTAR) 300 UNIT/ML Solostar Pen Inject 60 Units into the skin in the morning.   Yes [provider]  sitaGLIPtin (JANUVIA) 25 MG tablet Take 25 mg by mouth daily.   Yes [provider]  tirzepatide Darcel Bayley) 2.5 MG/0.5ML Pen Inject 2.5 mg into the skin once a week.   Yes [provider]  aspirin 81 MG tablet Take 81 mg by mouth daily.    [provider]  atorvastatin (LIPITOR) 20 MG tablet Take 20 mg by mouth every morning. 05/09/17   [provider]  gabapentin (NEURONTIN) 100 MG capsule Take 100 mg by mouth 3 (three) times daily.    [provider]  losartan (COZAAR) 50 MG tablet Take 50 mg by mouth daily. 06/06/17   [provider]  Multiple Vitamins-Minerals (CENTRUM PO) Take 1 tablet by mouth daily.    [provider]    Family History Family History  Problem Relation Age of Onset   Diabetes Mother    Diabetes Father    Diabetes Sister    Social History Social History   Tobacco Use   Smoking status: Never   Smokeless tobacco: Never  Substance Use Topics   Alcohol use: No   Drug  use: No   Allergies   Patient has no known allergies.  Review of Systems Review of Systems Pertinent findings revealed after performing a 14 point review of systems has been noted in the history of present illness.  Physical Exam Triage Vital Signs ED Triage Vitals  Enc Vitals Group     BP 10/08/21 0827 (!) 147/82     Pulse Rate 10/08/21 0827 72     Resp 10/08/21 0827 18     Temp 10/08/21 0827 98.3 F (36.8 C)     Temp Source 10/08/21 0827 Oral     SpO2 10/08/21 0827 98 %     Weight --      Height --      Head Circumference --      Peak Flow --      Pain Score 10/08/21 0826 5     Pain Loc --       Pain Edu? --      Excl. in [place]? --   No data found.  Updated Vital Signs BP (!) 165/83 (BP Location: Right Arm)   Pulse 90   Temp 98 F (36.7 C) (Oral)   Resp 16   SpO2 96%   Physical Exam Vitals and nursing note reviewed.  Constitutional:      General: She is not in acute distress.    Appearance: Normal appearance. She is not ill-appearing.  HENT:     Head: Normocephalic and atraumatic.  Eyes:     General: Lids are normal.        Right eye: No discharge.        Left eye: No discharge.     Extraocular Movements: Extraocular movements intact.     Conjunctiva/sclera: Conjunctivae normal.     Right eye: Right conjunctiva is not injected.     Left eye: Left conjunctiva is not injected.  Neck:     Trachea: Trachea and phonation normal.  Cardiovascular:     Rate and Rhythm: Normal rate and regular rhythm.     Pulses: Normal pulses.     Heart sounds: Normal heart sounds. No murmur heard.    No friction rub. No gallop.  Pulmonary:     Effort: Pulmonary effort is normal. No accessory muscle usage, prolonged expiration or respiratory distress.     Breath sounds: Normal breath sounds. No stridor, decreased air movement or transmitted upper airway sounds. No decreased breath sounds, wheezing, rhonchi or rales.  Chest:     Chest wall: No tenderness.  Abdominal:     General: Abdomen is flat. Bowel sounds are decreased. There is no distension.     Palpations: Abdomen is soft.     Tenderness: There is generalized abdominal tenderness. There is no right CVA tenderness or left CVA tenderness.     Hernia: No hernia is present.  Musculoskeletal:        General: Normal range of motion.     Cervical back: Normal range of motion and neck supple. Normal range of motion.     Lumbar back: Spasms and tenderness present. No bony tenderness. Normal range of motion. Negative right straight leg raise test and negative left straight leg raise test.  Lymphadenopathy:     Cervical: No cervical  adenopathy.  Skin:    General: Skin is warm and dry.     Findings: No erythema or rash.  Neurological:     General: No focal deficit present.     Mental Status: She is alert and oriented to person,  place, and time.  Psychiatric:        Mood and Affect: Mood normal.        Behavior: Behavior normal.     Visual Acuity Right Eye Distance:   Left Eye Distance:   Bilateral Distance:    Right Eye Near:   Left Eye Near:    Bilateral Near:     UC Couse / Diagnostics / Procedures:     Radiology No results found.  Procedures Procedures (including critical care time) EKG  Pending results:  Labs Reviewed  POCT URINALYSIS DIP (MANUAL ENTRY) - Abnormal; Notable for the following components:      Result Value   Glucose, UA >=1,000 (*)    All other components within normal limits  POCT FASTING CBG [facility] MANUAL ENTRY  CERVICOVAGINAL ANCILLARY ONLY    Medications Ordered in UC: Medications - No data to display  UC Diagnoses / Final Clinical Impressions(s)   I have reviewed the triage vital signs and the nursing notes.  Pertinent labs & imaging results that were available during my care of the patient were reviewed by me and considered in my medical decision making (see chart for details).    Final diagnoses:  Glucosuria  Acute bilateral low back pain without sciatica  Vaginitis and vulvovaginitis   Patient provided with a prescription for terconazole cream for comfort while she is waiting for the results of her vaginal swab.  Patient will be treated appropriately based on the results of her vaginal swab.  Patient advised that her urine glucose level is very elevated at this time but there is no concern for acute urinary tract infection.  Patient advised to follow-up with her primary care provider regarding her abdominal pain and lower back pain, both of which appear to be chronic.  ED Prescriptions     Medication Sig Dispense Auth. Provider   terconazole (TERAZOL 7) 0.4 %  vaginal cream Apply twice daily to vulvovaginal area, can reapply after every void, use for 7 days as needed 45 g Lynden Oxford Scales, PA-C      PDMP not reviewed this encounter.  Pending results:  Labs Reviewed  POCT URINALYSIS DIP (MANUAL ENTRY) - Abnormal; Notable for the following components:      Result Value   Glucose, UA >=1,000 (*)    All other components within normal limits  POCT FASTING CBG [facility] MANUAL ENTRY  CERVICOVAGINAL ANCILLARY ONLY    Discharge Instructions:   Discharge Instructions      The results of your vaginal swab test which tests for BV, yeast, gonorrhea, chlamydia and trichomonas will be posted to your MyChart account in the next 3 to 5 days.  If any of your results are abnormal, you will receive a phone call regarding further treatment.  Additional prescriptions, if any are needed, will be provided for you at your pharmacy.   Please abstain from sexual intercourse of any kind, vaginal, oral or anal, until until you have received the results of your test.   For comfort, while you are waiting for the result of your vaginal swab test, I have provided you with an external yeast infection cream that you can apply 3-4 times daily for relief of vaginal itching and burning.     Our point-of-care analysis of your urine sample today was normal and did not reveal any concern for urinary tract infection, only a significant amount of sugar in your urine.  Antibiotics are not needed at this time.   If you have  not had complete resolution of your symptoms after completing any needed treatment, please return for repeat evaluation.   Thank you for visiting urgent care today.  I appreciate the opportunity to participate in your care.       Disposition Upon Discharge:  Condition: stable for discharge home  Patient presented with an acute illness with associated systemic symptoms and significant discomfort requiring urgent management. In my opinion, this is a  condition that a prudent lay person (someone who possesses an average knowledge of health and medicine) may potentially expect to result in complications if not addressed urgently such as respiratory distress, impairment of bodily function or dysfunction of bodily organs.   Routine symptom specific, illness specific and/or disease specific instructions were discussed with the patient and/or caregiver at length.   As such, the patient has been evaluated and assessed, work-up was performed and treatment was provided in alignment with urgent care protocols and evidence based medicine.  Patient/parent/caregiver has been advised that the patient may require follow up for further testing and treatment if the symptoms continue in spite of treatment, as clinically indicated and appropriate.  Patient/parent/caregiver has been advised to return to the [facility] or PCP if no better; to PCP or the Emergency Department if new signs and symptoms develop, or if the current signs or symptoms continue to change or worsen for further workup, evaluation and treatment as clinically indicated and appropriate  The patient will follow up with their current PCP if and as advised. If the patient does not currently have a PCP we will assist them in obtaining one.   The patient may need specialty follow up if the symptoms continue, in spite of conservative treatment and management, for further workup, evaluation, consultation and treatment as clinically indicated and appropriate.   Patient/parent/caregiver verbalized understanding and agreement of plan as discussed.  All questions were addressed during visit.  Please see discharge instructions below for further details of plan.  This office note has been dictated using Museum/gallery curator.  Unfortunately, this method of dictation can sometimes lead to typographical or grammatical errors.  I apologize for your inconvenience in advance if this occurs.  Please do not  hesitate to reach out to me if clarification is needed.      Lynden Oxford Scales, PA-C 10/14/22 1931

## 2022-10-14 NOTE — ED Triage Notes (Signed)
The pt c/o lower back pain, abd pain, urinary frequency, and burning with urination that began last Saturday.

## 2022-10-14 NOTE — Discharge Instructions (Signed)
The results of your vaginal swab test which tests for BV, yeast, gonorrhea, chlamydia and trichomonas will be posted to your MyChart account in the next 3 to 5 days.  If any of your results are abnormal, you will receive a phone call regarding further treatment.  Additional prescriptions, if any are needed, will be provided for you at your pharmacy.   Please abstain from sexual intercourse of any kind, vaginal, oral or anal, until until you have received the results of your test.   For comfort, while you are waiting for the result of your vaginal swab test, I have provided you with an external yeast infection cream that you can apply 3-4 times daily for relief of vaginal itching and burning.     Our point-of-care analysis of your urine sample today was normal and did not reveal any concern for urinary tract infection, only a significant amount of sugar in your urine.  Antibiotics are not needed at this time.   If you have not had complete resolution of your symptoms after completing any needed treatment, please return for repeat evaluation.   Thank you for visiting urgent care today.  I appreciate the opportunity to participate in your care.

## 2022-10-17 LAB — CERVICOVAGINAL ANCILLARY ONLY
Bacterial Vaginitis (gardnerella): NEGATIVE
Candida Glabrata: NEGATIVE
Candida Vaginitis: NEGATIVE
Chlamydia: NEGATIVE
Comment: NEGATIVE
Comment: NEGATIVE
Comment: NEGATIVE
Comment: NEGATIVE
Comment: NEGATIVE
Comment: NORMAL
Neisseria Gonorrhea: NEGATIVE
Trichomonas: NEGATIVE

## 2023-01-03 ENCOUNTER — Ambulatory Visit
Admission: EM | Admit: 2023-01-03 | Discharge: 2023-01-03 | Disposition: A | Discharge status: Home / Self Care | Payer: Medicaid Other | Attending: Nurse Practitioner | Admitting: Nurse Practitioner

## 2023-01-03 ENCOUNTER — Ambulatory Visit (INDEPENDENT_AMBULATORY_CARE_PROVIDER_SITE_OTHER): Payer: Medicaid Other

## 2023-01-03 DIAGNOSIS — R519 Headache, unspecified: Secondary | ICD-10-CM | POA: Diagnosis not present

## 2023-01-03 DIAGNOSIS — R350 Frequency of micturition: Secondary | ICD-10-CM | POA: Insufficient documentation

## 2023-01-03 DIAGNOSIS — Z1152 Encounter for screening for COVID-19: Secondary | ICD-10-CM | POA: Insufficient documentation

## 2023-01-03 DIAGNOSIS — R509 Fever, unspecified: Secondary | ICD-10-CM

## 2023-01-03 DIAGNOSIS — Z79899 Other long term (current) drug therapy: Secondary | ICD-10-CM | POA: Diagnosis not present

## 2023-01-03 DIAGNOSIS — R051 Acute cough: Secondary | ICD-10-CM

## 2023-01-03 DIAGNOSIS — Z794 Long term (current) use of insulin: Secondary | ICD-10-CM | POA: Diagnosis not present

## 2023-01-03 DIAGNOSIS — B349 Viral infection, unspecified: Secondary | ICD-10-CM | POA: Diagnosis not present

## 2023-01-03 DIAGNOSIS — Z7984 Long term (current) use of oral hypoglycemic drugs: Secondary | ICD-10-CM | POA: Insufficient documentation

## 2023-01-03 DIAGNOSIS — E119 Type 2 diabetes mellitus without complications: Secondary | ICD-10-CM | POA: Insufficient documentation

## 2023-01-03 DIAGNOSIS — I10 Essential (primary) hypertension: Secondary | ICD-10-CM | POA: Insufficient documentation

## 2023-01-03 LAB — POCT URINALYSIS DIP (MANUAL ENTRY)
Bilirubin, UA: NEGATIVE
Blood, UA: NEGATIVE
Glucose, UA: >=1000 mg/dL — AB
Ketones, POC UA: NEGATIVE mg/dL
Leukocytes, UA: NEGATIVE
Nitrite, UA: NEGATIVE
Protein Ur, POC: NEGATIVE mg/dL
Spec Grav, UA: 1.01 (ref 1.010–1.025)
Urobilinogen, UA: 0.2 E.U./dL
pH, UA: 6.5 (ref 5.0–8.0)

## 2023-01-03 LAB — POCT FASTING CBG KUC MANUAL ENTRY: POCT Glucose (KUC): 184 mg/dL — AB (ref 70–99)

## 2023-01-03 NOTE — ED Triage Notes (Addendum)
Pt reports having fever (103F), sweating, headache, generalized body aches finger pain (left hand), and left foot pain. The patient states for the past few days she has had urinary incontinence.   Started: last Friday  Home interventions: tylenol, advil

## 2023-01-03 NOTE — ED Provider Notes (Signed)
[facility]    CSN: [id number] Arrival date & time: 01/03/23  1540      History   Chief Complaint Chief Complaint  Patient presents with   Headache   Generalized Body Aches   Fever    HPI Pollie Knapke is a 57 y.o. female who presents for evaluation of URI symptoms for 5 days.  Patient is [place] by her daughter.  Patient reports associated symptoms of fever of 101 to 103 degrees, body aches, dry cough, dry throat, headache, urinary  frequency and incontinence.  denies N/V/D, ear pain, sore throat, shortness of breath. Patient does not have a hx of asthma or smoking. No known sick contacts and no recent travel. Pt is vaccinated for COVID. Pt is vaccinated for flu this season. Pt has taken Tylenol and Motrin OTC for symptoms. Pt has no other concerns at this time.    Headache Associated symptoms: cough, fever and myalgias   Fever Associated symptoms: cough, headaches and myalgias     Past Medical History:  Diagnosis Date   Diabetes mellitus without complication ([place])    Hypertension    Lipid disorder    Peripheral neuropathy     There are no problems to display for this patient.   Past Surgical History:  Procedure Laterality Date   amputation 2 toes     CESAREAN SECTION      OB History   No obstetric history on file.      Home Medications    Prior to Admission medications   Medication Sig Start Date End Date Taking? Authorizing Provider  aspirin 81 MG tablet Take 81 mg by mouth daily.    [provider]  atorvastatin (LIPITOR) 20 MG tablet Take 20 mg by mouth every morning. 05/09/17   [provider]  empagliflozin (JARDIANCE) 25 MG TABS tablet Take 25 mg by mouth daily.    [provider]  escitalopram (LEXAPRO) 10 MG tablet Take 10 mg by mouth daily.    [provider]  gabapentin (NEURONTIN) 100 MG capsule Take 100 mg by mouth 3 (three) times daily.    [provider]  insulin aspart (NOVOLOG FLEXPEN) 100  UNIT/ML FlexPen Inject 7-10 Units into the skin 3 (three) times daily with meals. Inject 10 units with breakfast, 7 units with dinner    [provider]  insulin glargine, 2 Unit Dial, (TOUJEO MAX SOLOSTAR) 300 UNIT/ML Solostar Pen Inject 60 Units into the skin in the morning.    [provider]  losartan (COZAAR) 50 MG tablet Take 50 mg by mouth daily. 06/06/17   [provider]  Multiple Vitamins-Minerals (CENTRUM PO) Take 1 tablet by mouth daily.    [provider]  sitaGLIPtin (JANUVIA) 25 MG tablet Take 25 mg by mouth daily.    [provider]  terconazole (TERAZOL 7) 0.4 % vaginal cream Apply twice daily to vulvovaginal area, can reapply after every void, use for 7 days as needed 10/14/22   Lynden Oxford Scales, PA-C  tirzepatide [facility]) 2.5 MG/0.5ML Pen Inject 2.5 mg into the skin once a week.    [provider]    Family History Family History  Problem Relation Age of Onset   Diabetes Mother    Diabetes Father    Diabetes Sister     Social History Social History   Tobacco Use   Smoking status: Never   Smokeless tobacco: Never  Substance Use Topics   Alcohol use: No   Drug use: No  Allergies   Patient has no known allergies.   Review of Systems Review of Systems  Constitutional:  Positive for fever.  Respiratory:  Positive for cough.   Genitourinary:  Positive for frequency.  Musculoskeletal:  Positive for myalgias.  Neurological:  Positive for headaches.     Physical Exam Triage Vital Signs ED Triage Vitals  Enc Vitals Group     BP 01/03/23 1551 105/60     Pulse Rate 01/03/23 1551 96     Resp 01/03/23 1551 18     Temp 01/03/23 1551 97.9 F (36.6 C)     Temp Source 01/03/23 1551 Oral     SpO2 01/03/23 1551 95 %     Weight --      Height --      Head Circumference --      Peak Flow --      Pain Score 01/03/23 1548 10     Pain Loc --      Pain Edu? --      Excl. in GC? --    No data  found.  Updated Vital Signs BP 105/60 (BP Location: Left Arm)   Pulse 96   Temp 97.9 F (36.6 C) (Oral)   Resp 18   SpO2 95%   Visual Acuity Right Eye Distance:   Left Eye Distance:   Bilateral Distance:    Right Eye Near:   Left Eye Near:    Bilateral Near:     Physical Exam Vitals and nursing note reviewed.  Constitutional:      General: She is not in acute distress.    Appearance: She is well-developed. She is not ill-appearing.  HENT:     Head: Normocephalic and atraumatic.     Right Ear: Tympanic membrane and ear canal normal.     Left Ear: Tympanic membrane and ear canal normal.     Nose: Congestion present.     Mouth/Throat:     Mouth: Mucous membranes are moist.     Pharynx: Oropharynx is clear. Uvula midline. No oropharyngeal exudate or posterior oropharyngeal erythema.     Tonsils: No tonsillar exudate or tonsillar abscesses.  Eyes:     Conjunctiva/sclera: Conjunctivae normal.     Pupils: Pupils are equal, round, and reactive to light.  Cardiovascular:     Rate and Rhythm: Normal rate and regular rhythm.     Heart sounds: Normal heart sounds.  Pulmonary:     Effort: Pulmonary effort is normal.     Breath sounds: Normal breath sounds.  Abdominal:     Tenderness: There is no right CVA tenderness or left CVA tenderness.  Musculoskeletal:     Cervical back: Normal range of motion and neck supple.  Lymphadenopathy:     Cervical: No cervical adenopathy.  Skin:    General: Skin is warm and dry.  Neurological:     General: No focal deficit present.     Mental Status: She is alert and oriented to person, place, and time.  Psychiatric:        Mood and Affect: Mood normal.        Behavior: Behavior normal.      UC Treatments / Results  Labs (all labs ordered are listed, but only abnormal results are displayed) Labs Reviewed  POCT URINALYSIS DIP (MANUAL ENTRY) - Abnormal; Notable for the following components:      Result Value   Glucose, UA >=1,000 (*)     All other components within normal limits  POCT FASTING  CBG [facility] MANUAL ENTRY - Abnormal; Notable for the following components:   POCT Glucose ([facility]) 184 (*)    All other components within normal limits  SARS CORONAVIRUS 2 (TAT 6-24 HRS)    EKG   Radiology DG Chest 2 View  Result Date: 01/03/2023 CLINICAL DATA:  Fever and cough. EXAM: CHEST - 2 VIEW COMPARISON:  05/06/2022 FINDINGS: The heart size and mediastinal contours are within normal limits. Both lungs are clear. The visualized skeletal structures are unremarkable. IMPRESSION: No active cardiopulmonary disease. Electronically Signed   By: Marlaine Hind M.D.   On: 01/03/2023 16:19    Procedures Procedures (including critical care time)  Medications Ordered in UC Medications - No data to display  Initial Impression / Assessment and Plan / UC Course  I have reviewed the triage vital signs and the nursing notes.  Pertinent labs & imaging results that were available during my care of the patient were reviewed by me and considered in my medical decision making (see chart for details).     Reviewed exam and symptoms with patient and daughter. UA negative for UTI.  Did have a lot of glucose in urine, point-of-care blood glucose taken and was 184 Chest x-ray negative for pneumonia Discussed with patient likely flu like illness but given length of symptoms discussed symptomatic treatment Will check for COVID as well but noted that again given length of symptoms would not change treatment Continue over-the-counter analgesics for fever management Encourage rest and fluids PCP follow-up in 2 days for recheck Strict ER precautions reviewed and patient and daughter verbalized understanding  Final Clinical Impressions(s) / UC Diagnoses   Final diagnoses:  Urinary frequency  Fever, unspecified  Acute cough  Acute viral syndrome     Discharge Instructions      The clinical contact you with results of your COVID test is  positive Continue over-the-counter Tylenol or ibuprofen for fever management Rest and fluids Please go to the emergency room if you have any worsening symptoms Follow-up with your PCP in 2 days for recheck     ED Prescriptions   None    PDMP not reviewed this encounter.   Melynda Ripple, NP 01/03/23 [phone]

## 2023-01-03 NOTE — Discharge Instructions (Signed)
The clinical contact you with results of your COVID test is positive Continue over-the-counter Tylenol or ibuprofen for fever management Rest and fluids Please go to the emergency room if you have any worsening symptoms Follow-up with your PCP in 2 days for recheck

## 2023-01-04 LAB — SARS CORONAVIRUS 2 (TAT 6-24 HRS): SARS Coronavirus 2: NEGATIVE

## 2024-10-31 NOTE — Group Note (Signed)
 Rehab Team Discussion  Barriers to Discharge/Efforts to Remove Barriers Barriers to Discharge: Increased level of assistance required  Interventions to Remove Barriers: Caregiver Training  Family Education Date: Ongoing  Interdisciplinary Weekly Goals Caregiver training  Summary of Functional Status Patient is making progress  Continued Stay Review The patient requires supervision by a physician with specialized training/experience in rehabilitation medicine for the reasons detailed above. The patient also requires active and ongoing interventions of rehabilitation nursing available 24 hours per day. The patient also requires the active and ongoing interventions of PT and OT  Inpatient Rehab Therapy Program Start/continue therapy 3 hours/day x 5 days per week  Rehab Discharge Assessment Inpatient rehabilitation remains the most appropriate level of care  Recommendations Based on Team Discussion: Anticipated Discharge Destination Home w/ outpatient  Assistance Recommended at Home Intermittent assistance for mobility/ADL's  Follow-up Recommended Occupational Therapy Physical Therapy  Anticipated Discharge Date 11/05/24  Physician Statement: I led the weekly Inpatient Rehab Team Conference today and concur with all decisions made by the interdisciplinary team. I discussed with staff the patient's rehabilitation program addressing functional goals (including current condition, impairments, functional status, treatments, barriers to achievement of goals, and estimated discharge date/disposition).  The patient continues to require acute inpatient rehabilitation services including close medical supervision needed, rehabilitation nursing requirements, therapies needed (PT, OT), rehab psychology, spiritual care, nutrition assessment, and case management.  Acute inpatient rehabilitation is required in order to maximize the patient's functional independence in ADLS, mobility,  transfers, bladder/bowel program care, skin management, respiratory care, and patient/family education. The patient continues to progress toward their goals.    Assessment and Plan:  Impairment Group Code: Amputation: Transmetatarsal amputation. Etiologic Diagnosis Code/Description: Acute osteomyelitis of metatarsal bone of left foot (CMD) M86.172. Diabetic ulcer of left foot (CMD) E11.621, L97.529.    Impaired mobility and ADLs--PT/OT/RT to begin initial evaluation and treatment working toward functional mobility, safety awareness, and maximized independence.    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 Joshua) - Dressings C/D/I - Sutures to stay in for 3 weeks - Afebrile - ID recommends Dapto, Ceftriaxone with end date 12/03/24  - Held atorvastatin due to interaction with Daptomycin - F/u appointment pending - Podiatry on board   T2DM (A1c 8.2%) - GMT consulted will follow up on recs  - Fingersticks AC (before meals) and HS (nightly)  - Inpatient blood glucose target:140-180 mg/dl   - Diabetes Medication Regimen :  - Continue home Metformin 24 hr 1000 mg daily  - Mounjaro 10 mg injection weekly  - Added basal Lantus insulin  25 units daily  - Added prandial Lispro 8 units pc  - Correction Lispro insulin  0-15 units ac and hs for blood glucose > 180 mg/dl   - Hypoglycemia Precautions  - Nutrition: Adult Medium Consistent Carb Diet. Please page GMT on call with any nutrition changes or orders.  GAD (Generalized anxiety disorder) Depressive disorder - Continue home Lexapro   Hypomagnesemia - Oral supplement daily 400 mg PO   DVT Prophylaxis - Aspirin 81 mg daily   Total time spent on patient: 15 minutes   Madeline Chang, DPM This is a shared visit with Dr. Ryan Helga Moats, MD.   Electronically signed by:  Madeline Chang, DPM 10/31/2024 10:34 AM

## 2024-11-01 NOTE — Group Note (Signed)
 On track. Pt has needed DME. No training to date

## 2024-11-11 NOTE — Progress Notes (Signed)
 [facility] - [facility] Visit Note 11/11/24     CHIEF COMPLAINT Patient presents for Diabetic Eye Exam   HISTORY OF PRESENT ILLNESS: Madeline Chang is a 58 y.o. female who presents to the clinic today for:   HPI     Diabetic Eye Exam   The patient is here for a return visit.        Comments   LV: 02/14/2024  Patient here for 6 MTH DEE, OCT MAC.   Patient states vision is stable and functions well without glasses at distance. Using Prescription glasses for reading only, does not feel she needs them for distance.  Comfort of eyes: was having pain and FBS in left eye starting 2 mths ago but has resolved mostly now, no pain just mild discomfort now. Itching in both eyes.  Denies current eye pain, redness, dryness, tearing or photophobia.  Denies flashes, new floaters or dark curtain/veil.  Using Thera Tears 1x nightly both eyes.   IDDMx 35 years. On Insulin  and oral meds.  BS= 140 this AM.  Lab Results      Component                Value               Date                      HGBA1C                   8.2 (H)             10/21/2024                POCA1C                   7.8 (A)             05/30/2024            Currently on Antibiotics, short term.       Last edited by Estefana LOISE Sar on 11/11/2024  3:55 PM.      CURRENT MEDICATIONS: Current Outpatient Medications on File Prior to Visit  Medication Sig Dispense Refill   acetaminophen  (TYLENOL ) 500 mg tablet Take 1 tablet (500 mg total) by mouth every 6 (six) hours as needed for headaches or mild pain (1-3) (Fever GREATER THAN 100.4 F(38 C)).     ascorbic acid (VITAMIN C) 500 mg tablet Take 1 tablet (500 mg total) by mouth 2 (two) times a day.     aspirin 81 mg EC tablet Take 81 mg by mouth daily.     [Paused] atorvastatin (LIPITOR) 40 mg tablet Take 1 tablet (40 mg total) by mouth daily. 90 tablet 3   BD Nano 2nd Gen Pen Needle 32 gauge x 5/32 ndle USE TO INJECT THREE TIMES DAILY AS DIRECTED  300 each 3   biotin (Hair, Skin and Nails, biotin,) 10,000 mcg chew Chew 1 Dose daily.     blood-glucose meter misc 2 Devices by miscellaneous route 2 (two) times a day. 90 each 0   blood-glucose sensor (Dexcom G7 Sensor) INJECT 1 SENSOR INTO THE SKIN EVERY 10 DAYS FOR CONTINUOUS GLUCOSE MONITORING 3 each 11   calcium carbonate-vitamin D3 500 mg (200 mg Ca)-5 mcg (200 units Vit D) per tablet Take 1 tablet by mouth every morning.     cefTRIAXone (ROCEPHIN) inj Infuse 2 g into a venous catheter daily Indications: osteomyelitis. Infuse as  IV push over 3 to 5 minutes (adults only) or over 30 minute infusion per pharmacy protocol.  End date 12/03/24. Medication Provided by [facility] [phone]). Ancillary medication orders: Normal saline 0.9% flush 10ml before and after medication; Heparin 10unit/ml flush 3-5ml after saline to unused lumen as directed; Epinephrine 0.3mg  (0.3ml) IM PRN anaphylaxis; Diphenhydramine 25-50mg  (0.5-1ml) IM PRN anaphylaxis. 64 g 0   cyanocobalamin (VITAMIN B12) 1,000 mcg tablet Take 1,000 mcg by mouth daily.     DAPTOmycin (CUBICIN) IV Infuse 10 mL (500 mg total) into a venous catheter daily Indications: osteomyelitis. Infuse as IV push over 2-5 minutes (adults only) or over 30 minutes per pharmacy protocol.  End date 12/03/24 Medication Provided by [facility] [phone]). Ancillary medication orders: Normal saline 0.9% flush 10ml before and after medication; Heparin 10unit/ml flush 3-5ml after saline to unused lumen as directed; Epinephrine 0.3mg  (0.3ml) IM PRN anaphylaxis; Diphenhydramine 25-50mg  (0.5-84ml) IM PRN anaphylaxis. 16 g 0   escitalopram (LEXAPRO) 10 mg tablet TAKE 1 TABLET BY MOUTH EVERY DAY 90 tablet 1   ferrous sulfate (FeroSuL) 325 mg (65 mg iron) tablet Take 1 tablet (325 mg total) by mouth daily. 90 tablet 3   fluticasone propionate (FLONASE) 50 mcg/spray nasal spray SHAKE LIQUID AND USE 2 SPRAYS IN EACH NOSTRIL  DAILY 48 mL 1   gabapentin (NEURONTIN) 100 mg capsule Take 1 capsule (100 mg total) by mouth nightly. 90 capsule 3   insulin  aspart U-100 (NovoLOG  FlexPen) 100 unit/mL (3 mL) pen Inject 15 Units under the skin 3 (three) times a day with meals Indications: type 2 diabetes mellitus. + home corrective scale insulin  with meals.     insulin  glargine U-300 (Toujeo Max U-300 SoloStar) 300 unit/mL (3 mL) pen pen Inject 42 Units under the skin daily Indications: type 2 diabetes mellitus.     insulin  syringe-needle U-100 1 mL 31 gauge x 15/64 syrg Use to inject insulin  up to 4x a day 200 each 11   loratadine (CLARITIN) 10 mg tablet Take 10 mg by mouth daily.     losartan (COZAAR) 50 mg tablet Take 1 tablet (50 mg total) by mouth daily. 90 tablet 3   metFORMIN (GLUCOPHAGE) 1,000 mg tablet Take 1 tablet (1,000 mg total) by mouth in the morning and 1 tablet (1,000 mg total) in the evening. Take with meals. 60 tablet 11   multivitamin (THERAGRAN) tab tablet Take 1 tablet by mouth daily.     oxyCODONE (ROXICODONE) 5 mg immediate release tablet Take 1 tablet (5 mg total) by mouth every 6 (six) hours as needed for moderate pain (4-6) Indications: pain. 28 tablet 0   polyethylene glycol (GLYCOLAX) 17 gram packet Take 17 g by mouth daily.     tirzepatide (Mounjaro) 10 mg/0.5 mL subcutaneous pen injector Inject 0.5 mL (10 mg total) under the skin every 7 days. On Monday  Dx E11.90     No current facility-administered medications on file prior to visit.    Referring physician: No referring provider defined for this encounter.  ALLERGIES Allergies  Allergen Reactions   Ciprofloxacin Rash    PAST MEDICAL HISTORY Past Medical History:  Diagnosis Date   Cataract    Diabetes (CMD) 1994   Dx 1994 Type 2 DM, on insulin    Diabetic retinopathy    (CMD)    Glaucoma    Hypertension    controlled with med   Numbness and tingling of both feet    Also legs   OSA on CPAP  States dose not use  at night as much   Posterior capsular opacification visually significant of left eye    Type 2 diabetes mellitus with diabetic polyneuropathy, with long-term current use of insulin     (CMD) 01/30/2017   DM diagnosed at age of 4s.    Past Surgical History:  Procedure Laterality Date   CATARACT EXTRACTION W/  INTRAOCULAR LENS IMPLANT Right 09/02/2021   Procedure: CATARACT EXTRACTION W/  INTRAOCULAR LENS IMPLANT; Dr Huey HOMES, SN # [id number], +23.0 D   CATARACT EXTRACTION W/  INTRAOCULAR LENS IMPLANT Right 09/02/2021   Procedure: CATARACT EXTRACTION RIGHT EYE /W IMPLANT;  Surgeon: Comer Jerilee Huey, MD;  Location: [facility];  Service: Ophthalmology;  Laterality: Right;  30TIP; PF LIDOCAINE, BSS/EPI (8:2), IDDM, TRYPAN BLUE +/-, HONAN   CATARACT EXTRACTION W/  INTRAOCULAR LENS IMPLANT Left 10/07/2021   Procedure: CATARACT EXTRACTION LEFT EYE /W IMPLANT;  Surgeon: Comer Jerilee Huey, MD;  Location: [facility];  Service: Ophthalmology;  Laterality: Left;  30TIP; PF LIDOCAINE; BSS/EPI (8:2), IDDM, HONAN   CATARACT EXTRACTION W/  INTRAOCULAR LENS IMPLANT Left 10/07/2021   Procedure: CATARACT EXTRACTION W/  INTRAOCULAR LENS IMPLANT; Dr Huey HOMES, SN # [id number], +23.0 D   CESAREAN SECTION, UNSPECIFIED     Procedure: CESAREAN SECTION   INCISION AND DRAINAGE FOOT Right 01/06/2023   Procedure: INCISION AND DRAINAGE FOOT;  Surgeon: Orland Norleen Mt, DPM;  Location: [facility] OR;  Service: Podiatry;  Laterality: Right;   INCISION AND DRAINAGE FOOT Right 01/13/2023   Procedure: INCISION AND DRAINAGE FOOT;  Surgeon: Orland Norleen Mt, DPM;  Location: [facility] OR;  Service: Podiatry;  Laterality: Right;  with local block   LEG AMPUTATION Right 03/30/2023   AMPUTATION LEG BELOW KNEE performed by Ozell JINNY Rigg, MD at [facility] OR   TOE AMPUTATION Left 10/22/2024   AMPUTATION TOE RAY performed by Ozell DELENA Molt, DPM at [facility]   TOE SURGERY Left     Procedure: TOE SURGERY; x2 blisters next to big toe   YAG CAPSULOTOMY Right 12/19/2023   Dr Huey   YAG CAPSULOTOMY Left 12/27/2023   Dr Huey    FAMILY HISTORY Family History  Problem Relation Name Age of Onset   Cataracts Mother     Diabetes Father     Cataracts Father     Glaucoma Father     Diabetes Sister     Cataracts Sister     Cataracts Maternal Grandmother     Cataracts Maternal Grandfather     Diabetes Paternal Grandmother     Cataracts Paternal Grandmother     Cataracts Paternal Grandfather     Diabetes Brother     Glaucoma Brother     Breast cancer Neg Hx     Macular degeneration Neg Hx     Blindness Neg Hx     Ovarian cancer Neg Hx      SOCIAL HISTORY Social History   Tobacco Use   Smoking status: Never   Smokeless tobacco: Never  Vaping Use   Vaping status: Never Used  Substance Use Topics   Alcohol use: No   Drug use: Never          OPHTHALMIC EXAM:  Base Eye Exam     Visual Acuity (Snellen - Linear)       Right Left   Dist [employer] 20/20 -1 20/20 -2   Dist cc 20/20 20/20 -2    Correction: Glasses  Tonometry (Applanation, 3:44 PM)       Right Left   Pressure 17 18         Max Pressure       Right Left   Max  25         Pupils       Pupils   Right PERRL   Left PERRL         Visual Fields (Counting fingers)       Left Right    Full Full         Extraocular Movement       Right Left    Full Full         Neuro/Psych     Oriented x3: Yes   Mood/Affect: Normal         Dilation     Both eyes: 2.5% Phenylephrine, 1.0% Tropicamide @ 3:44 PM           Slit Lamp and Fundus Exam     External Exam       Right Left   External Normal Normal         Slit Lamp Exam       Right Left   Lids/Lashes Normal Normal   Conjunctiva/Sclera White and quiet White and quiet   Cornea Inferior 1+ PEK, irreg epi Inferior 1+ PEK, irreg epi   Anterior Chamber Deep and quiet  Deep and quiet   Iris Dilated Dilated   Lens PC IOL, s/p YAG PC IOL, s/p YAG         Fundus Exam       Right Left   Posterior Vitreous Normal Normal   Disc large nerve, mild thinning inf tilted, mild thin sup   C/D Ratio 0.7 0.8   Macula Normal Normal   Vessels Vascular attenuation Vascular attenuation   Periphery DBH in arcade Normal           Refraction     Wearing Rx       Sphere Cylinder Axis Add   Right [place] -0.50 100 +2.50   Left [place] Sphere  +2.50    Age: 11yr   Type: PAL         Manifest Refraction       Sphere Cylinder Axis Dist VA Add Near [place]   Right [place] -0.25 103 20/20 +2.25 J1+   Left +0.75 -1.00 091 20/20 +2.25 J1+         Manifest Refraction #2 (Auto)       Sphere Cylinder Axis Dist VA Add Near [place]   Right [place] -0.25 120      Left +0.75 -0.75 092                  IMAGING AND PROCEDURES: OCT, Macula - OU - Both Eyes       Optical Coherence Tomography  Test Date: 11/11/2024.   Right Eye  Findings: no clinically significant macular edema.   Left Eye  Findings: no clinically significant macular edema.        ASSESSMENT/PLAN:  1. 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 Current use of insulin  (HCC)      3. Primary open angle glaucoma (POAG) of both eyes, mild stage      4. Dry eye syndrome of both lacrimal glands      5. Hypertensive  retinopathy of both eyes      6. Astigmatism of both eyes with presbyopia        NPDR OD, hx  DR OS             - Establishing care  07/10/19             - With DBH OS on exam  - A1c 8.2 (10/2024) previously 9+             - Discussed with patient, and discussed needs better glucose control, BP, lipid control              - Never smoker              - monitor  POAG OU, mild: discussed Anatomical narrow angle s/p ce iol              - increased cupping  OU, +DM, +thin pachy OU  - No FH of glaucoma, AA             - baseline optic disc photos 07/10/19             - gonio 07/10/19 & 10/23/2019, 08/26/2020, 06/16/2021- narrow OD              HVF 02/14/2024  no blind spot od, ok os             NFL OCT 02/14/2024 stable             IOP stable, will monitor              F/u 4 mo IOP check, NFL , VF 30-2  Dry eye OU  Discussed with pt today  Recommend starting AT's BID OU and ointment at bedtime- list of drops given today  HTN retinopathy  -glucose, BP, lipid control per PCP    Refractive error OU:  - refraction for medical purposes only  Medication ordered this visit:  No orders of the defined types were placed in this encounter.      Return in about 4 months (around 03/12/2025) for Glaucoma eval, IOP check, 30-2 VF, NFL OCT, No dilation.  Patient Instructions  Keep next scheduled appointment, call sooner with any concerns.   Explained the diagnoses, plan, and follow up with the patient and they expressed understanding.  Patient expressed understanding of the importance of proper follow up care.   This document serves as a record of services personally performed by Comer Jerilee Lauth, MD.  It was created on their behalf by Harlene Earnie Ming, COA, a trained medical scribe, and Certified Ophthalmic Assistant (COA). During the course of documenting the history, physical exam and medical decision making, I was functioning as a stage manager. The creation of this record is the providers dictation and/or activities during the visit.  Electronically signed by Harlene Earnie Ming, COA 11/11/2024 3:52 PM   Abbreviations: M myopia (nearsighted); A astigmatism; H hyperopia (farsighted); P presbyopia; Mrx spectacle prescription;  CTL contact lenses; OD right eye; OS left eye; OU both eyes  XT exotropia; ET esotropia; PEK punctate epithelial keratitis; PEE punctate epithelial erosions; DES dry eye syndrome; MGD meibomian gland dysfunction; ATs  artificial tears; PFAT's preservative free artificial tears; NSC nuclear sclerotic cataract; PSC posterior subcapsular cataract; ERM epi-retinal membrane; PVD posterior vitreous detachment; RD retinal detachment; DM diabetes mellitus; DR diabetic retinopathy; NPDR non-proliferative diabetic retinopathy; PDR proliferative diabetic retinopathy; CSME clinically significant macular edema; DME diabetic macular edema; dbh dot blot hemorrhages; CWS cotton wool  spot; POAG primary open angle glaucoma; C/D cup-to-disc ratio; HVF humphrey visual field; GVF goldmann visual field; OCT optical coherence tomography; IOP intraocular pressure; BRVO Branch retinal vein occlusion; CRVO central retinal vein occlusion; CRAO central retinal artery occlusion; BRAO branch retinal artery occlusion; RT retinal tear; SB scleral buckle; PPV pars plana vitrectomy; VH Vitreous hemorrhage; PRP panretinal laser photocoagulation; IVK intravitreal kenalog; VMT vitreomacular traction; MH Macular hole;  NVD neovascularization of the disc; NVE neovascularization elsewhere; AREDS age related eye disease study; ARMD age related macular degeneration; POAG primary open angle glaucoma; EBMD epithelial/anterior basement membrane dystrophy; ACIOL anterior chamber intraocular lens; IOL intraocular lens; PCIOL posterior chamber intraocular lens; Phaco/IOL phacoemulsification with intraocular lens placement; PRK photorefractive keratectomy; LASIK laser assisted in situ keratomileusis; HTN hypertension; DM diabetes mellitus; COPD chronic obstructive pulmonary disease   Electronically signed by: Comer Jerilee Lauth, MD 11/12/2024 8:39 AM

## 2024-11-12 NOTE — Progress Notes (Signed)
 Diagnosis:  1. Status post transmetatarsal amputation of foot, right (CMD)       Assessment & Plan 1.  Status post transmetatarsal amputation left foot (10/22/2024). The surgical site appears to be healing well, with no signs of infection.  Superficial area of dehiscence/scabbing noted centrally and laterally. Occasional phantom pain is reported, but there are no significant issues with the current prosthetic. Weight-bearing on the affected foot should be avoided for the next 3 weeks to prevent gapping of the wound.  Betadine should be applied daily or every other day to the incision site, and Aquaphor can be used around the area to keep the skin moisturized. The dressing should be changed at least every other day, and the foot should not be submerged in water during baths or showers. Steri-Strips will be provided for additional support.  Continue to heed ID guidance for antibiotic therapy (she is slated to complete parenteral antibiotics through 12/04/2024).  Follow-up: The patient will follow up on 12/03/2024.   Future Appt.:  Future Appointments  Date Time Provider Department Center  11/12/2024  2:20 PM Ilona Davidovna [place], [place] [facility] END CN None  01/22/2025  1:20 PM Ryan Helga Moats, MD [facility] PMR MPM [facility] MP [place]  02/11/2025  2:00 PM Ilona Davidovna Lowry, DO [facility] END CN None  03/18/2025  3:00 PM [facility] OPH [place] [facility] 1565 NUD  03/18/2025  3:30 PM Comer Pillow Tsamis, MD [facility] 15[address] NUD     11/12/2024   Chief Complaint:  Chief Complaint  Patient presents with   Post-op   History of Present Illness Madeline Chang is a 58 year old female who presents for follow-up approximately 3 weeks after undergoing a transmetatarsal amputation of the left foot due to a 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 This procedure was performed on 10/22/2024. She is accompanied by her daughter Madeline Chang.  She reports  overall well-being but experiences mild stomach discomfort, described as a feeling of fullness and cramping, which she attributes to her antibiotic regimen. She does not have diarrhea, vomiting, or nausea. She is currently on Rocephin and daptomycin, administered via a PICC line, with the course expected to conclude before Christmas Eve. A home health nurse is monitoring her condition.  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 She reports no current phantom sensation on the right side but notes that knee pain can sometimes mimic foot pain.  She has been bearing weight on her heel as needed and uses sandals for mobility. She resides with her husband and daughter, who provide assistance as needed. She has completed her physical therapy program and is not currently receiving any additional therapy. She has been using a prosthetic device, which initially presented some fitting issues, but these have since been resolved with a new liner. She has been managing her weight-bearing activities effectively and demonstrates good transfer skills, including the ability to independently enter and exit vehicles.  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   *Note - This document was created with voice recognition software. While this note has been edited for accuracy, the software periodically misinterprets speech, resulting in errors that might not  have been caught in editing. In the event that you find an unusual error in this record, please notify us .   Review of Systems A complete ROS was performed with pertinent positives/negatives noted in the  HPI. The remainder of the ROS are negative.  Vitals: Vitals:   11/12/24 1025  BP: 124/72    Past Medical Hx:  Medical History[1]  Current Meds:  Current Medications[2]  Allergies:  Allergies[3]  Past Surgical Hx:  Surgical History[4]  Family Hx: Family History[5]  Social Hx: Social History   Socioeconomic History   Marital status: Married    Spouse name: Not on file   Number of children: Not on file   Years of education: Not on file   Highest education level: Not on file  Occupational History   Not on file  Tobacco Use   Smoking status: Never   Smokeless tobacco: Never  Vaping Use   Vaping status: Never Used  Substance and Sexual Activity   Alcohol use: No   Drug use: Never   Sexual activity: Not Currently  Other Topics Concern   Not on file  Social History Narrative   Not on file   Social Drivers of Health   Financial Resource Strain: Not on file  Food Insecurity: Low Risk  (10/28/2024)   Food vital sign    Within the past 12 months, you worried that your food would run out before you got money to buy more: Never true    Within the past 12 months, the food you bought just didn't last and you didn't have money to get more: Never true  Recent Concern: Food Insecurity - Medium Risk (10/18/2024)   Food vital sign    Within the past 12 months, you worried that your food would run out before you got money to buy more: Patient declined to answer    Within the past 12 months, the food you bought just didn't last and you didn't have money to get more: Sometimes true  Transportation Needs: No Transportation Needs (11/05/2024)   PRAPARE - Administrator, Civil Service (Medical): No    Lack of Transportation (Non-Medical): No  Social Connections: Not on file  Safety: Low Risk  (10/28/2024)   Safety    How often does anyone, including family and friends, physically hurt you?: Never    How often does anyone, including family and  friends, insult or talk down to you?: Never    How often does anyone, including family and friends, threaten you with harm?: Never    How often does anyone, including family and friends, scream or curse at you?: Never  Living Situation: Low Risk  (10/28/2024)   Living Situation    What is your living situation today?: I have a steady place to live    Think about the place you live. Do you have problems with any of the following? Choose all that apply:: None/None on this list  Recent Concern: Living Situation - Medium Risk (08/31/2024)   Living Situation    What is your living situation today?: I have a place to live today, but I am worried about losing it in the future    Think about the place you live. Do you have problems with any of the following? Choose all that apply:: Pt declined to answer     Exam:  History of BKA right Palpable pedal pulses left 1+ edema left foot and ankle Incision site distal forefoot for the most part intact/stable;  sutures removed Area of superficial dehiscence noted centrally/laterally along the incision site No active drainage No purulence or fluctuance Some dry/desquamated skin No calf tenderness or lymphangitis      [1] Past Medical History: Diagnosis Date   Cataract    Diabetes (CMD) 1994   Dx 1994 Type 2 DM, on insulin    Diabetic retinopathy    (CMD)    Glaucoma    Hypertension    controlled with med   Numbness and tingling of both feet    Also legs   OSA on CPAP    States dose not use at night as much   Posterior capsular opacification visually significant of left eye    Type 2 diabetes mellitus with diabetic polyneuropathy, with long-term current use of insulin     (CMD) 01/30/2017   DM diagnosed at age of 44s.   [2]  Current Outpatient Medications:    acetaminophen  (TYLENOL ) 500 mg tablet, Take 1 tablet (500 mg total) by mouth every 6 (six) hours as needed for headaches or mild pain (1-3) (Fever GREATER THAN 100.4  F(38 C))., Disp: , Rfl:    ascorbic acid (VITAMIN C) 500 mg tablet, Take 1 tablet (500 mg total) by mouth 2 (two) times a day., Disp: , Rfl:    aspirin 81 mg EC tablet, Take 81 mg by mouth daily., Disp: , Rfl:    [Paused] atorvastatin (LIPITOR) 40 mg tablet, Take 1 tablet (40 mg total) by mouth daily., Disp: 90 tablet, Rfl: 3   BD Nano 2nd Gen Pen Needle 32 gauge x 5/32 ndle, USE TO INJECT THREE TIMES DAILY AS DIRECTED, Disp: 300 each, Rfl: 3   biotin (Hair, Skin and Nails, biotin,) 10,000 mcg chew, Chew 1 Dose daily., Disp: , Rfl:    blood-glucose meter misc, 2 Devices by miscellaneous route 2 (two) times a day., Disp: 90 each, Rfl: 0   blood-glucose sensor (Dexcom G7 Sensor), INJECT 1 SENSOR INTO THE SKIN EVERY 10 DAYS FOR CONTINUOUS GLUCOSE MONITORING, Disp: 3 each, Rfl: 11   calcium carbonate-vitamin D3 500 mg (200 mg Ca)-5 mcg (200 units Vit D) per tablet, Take 1 tablet by mouth every morning., Disp: , Rfl:    cefTRIAXone (ROCEPHIN) inj, Infuse 2 g into a venous catheter daily Indications: osteomyelitis. Infuse as IV push over 3 to 5 minutes (adults only) or over 30 minute infusion per pharmacy protocol.  End date 12/03/24. Medication Provided by [facility] [phone]). Ancillary medication orders: Normal saline 0.9% flush 10ml before and after medication; Heparin 10unit/ml flush 3-5ml after saline to unused lumen as directed; Epinephrine 0.3mg  (0.3ml) IM PRN anaphylaxis; Diphenhydramine 25-50mg  (0.5-100ml) IM PRN anaphylaxis., Disp: 64 g, Rfl: 0   cyanocobalamin (VITAMIN B12) 1,000 mcg tablet, Take 1,000 mcg by mouth daily., Disp: , Rfl:    DAPTOmycin (CUBICIN) IV, Infuse 10 mL (500 mg total) into a venous catheter daily Indications: osteomyelitis. Infuse as IV push over 2-5 minutes (adults only) or over 30 minutes per pharmacy protocol.  End date 12/03/24 Medication Provided by [facility] [phone]). Ancillary medication orders: Normal saline 0.9%  flush 10ml before and after medication; Heparin 10unit/ml flush 3-5ml after saline to unused lumen as directed; Epinephrine 0.3mg  (0.3ml) IM PRN anaphylaxis; Diphenhydramine 25-50mg  (0.5-1ml) IM PRN anaphylaxis., Disp: 16 g, Rfl: 0   escitalopram (LEXAPRO) 10 mg tablet, TAKE 1 TABLET BY MOUTH EVERY DAY, Disp: 90 tablet, Rfl: 1   ferrous sulfate (FeroSuL) 325 mg (65 mg iron) tablet, Take 1 tablet (325 mg total)  by mouth daily., Disp: 90 tablet, Rfl: 3   fluticasone propionate (FLONASE) 50 mcg/spray nasal spray, SHAKE LIQUID AND USE 2 SPRAYS IN EACH NOSTRIL DAILY, Disp: 48 mL, Rfl: 1   gabapentin (NEURONTIN) 100 mg capsule, Take 1 capsule (100 mg total) by mouth nightly., Disp: 90 capsule, Rfl: 3   insulin  aspart U-100 (NovoLOG  FlexPen) 100 unit/mL (3 mL) pen, Inject 15 Units under the skin 3 (three) times a day with meals Indications: type 2 diabetes mellitus. + home corrective scale insulin  with meals., Disp: , Rfl:    insulin  glargine U-300 (Toujeo Max U-300 SoloStar) 300 unit/mL (3 mL) pen pen, Inject 42 Units under the skin daily Indications: type 2 diabetes mellitus., Disp: , Rfl:    insulin  syringe-needle U-100 1 mL 31 gauge x 15/64 syrg, Use to inject insulin  up to 4x a day, Disp: 200 each, Rfl: 11   loratadine (CLARITIN) 10 mg tablet, Take 10 mg by mouth daily., Disp: , Rfl:    losartan (COZAAR) 50 mg tablet, Take 1 tablet (50 mg total) by mouth daily., Disp: 90 tablet, Rfl: 3   metFORMIN (GLUCOPHAGE) 1,000 mg tablet, Take 1 tablet (1,000 mg total) by mouth in the morning and 1 tablet (1,000 mg total) in the evening. Take with meals., Disp: 60 tablet, Rfl: 11   multivitamin (THERAGRAN) tab tablet, Take 1 tablet by mouth daily., Disp: , Rfl:    oxyCODONE (ROXICODONE) 5 mg immediate release tablet, Take 1 tablet (5 mg total) by mouth every 6 (six) hours as needed for moderate pain (4-6) Indications: pain., Disp: 28 tablet, Rfl: 0   polyethylene glycol (GLYCOLAX) 17 gram packet, Take  17 g by mouth daily., Disp: , Rfl:    tirzepatide (Mounjaro) 10 mg/0.5 mL subcutaneous pen injector, Inject 0.5 mL (10 mg total) under the skin every 7 days. On Monday  Dx E11.90, Disp: , Rfl:  [3] Allergies Allergen Reactions   Ciprofloxacin Rash  [4] Past Surgical History: Procedure Laterality Date   CATARACT EXTRACTION W/  INTRAOCULAR LENS IMPLANT Right 09/02/2021   Procedure: CATARACT EXTRACTION W/  INTRAOCULAR LENS IMPLANT; Dr Huey HOMES, SN # [id number], +23.0 D   CATARACT EXTRACTION W/  INTRAOCULAR LENS IMPLANT Right 09/02/2021   Procedure: CATARACT EXTRACTION RIGHT EYE /W IMPLANT;  Surgeon: Comer Jerilee Huey, MD;  Location: [facility];  Service: Ophthalmology;  Laterality: Right;  30TIP; PF LIDOCAINE, BSS/EPI (8:2), IDDM, TRYPAN BLUE +/-, HONAN   CATARACT EXTRACTION W/  INTRAOCULAR LENS IMPLANT Left 10/07/2021   Procedure: CATARACT EXTRACTION LEFT EYE /W IMPLANT;  Surgeon: Comer Jerilee Huey, MD;  Location: [facility] OR;  Service: Ophthalmology;  Laterality: Left;  30TIP; PF LIDOCAINE; BSS/EPI (8:2), IDDM, HONAN   CATARACT EXTRACTION W/  INTRAOCULAR LENS IMPLANT Left 10/07/2021   Procedure: CATARACT EXTRACTION W/  INTRAOCULAR LENS IMPLANT; Dr Huey HOMES, SN # [id number], +23.0 D   CESAREAN SECTION, UNSPECIFIED     Procedure: CESAREAN SECTION   INCISION AND DRAINAGE FOOT Right 01/06/2023   Procedure: INCISION AND DRAINAGE FOOT;  Surgeon: Orland Norleen Mt, DPM;  Location: [facility] OR;  Service: Podiatry;  Laterality: Right;   INCISION AND DRAINAGE FOOT Right 01/13/2023   Procedure: INCISION AND DRAINAGE FOOT;  Surgeon: Orland Norleen Mt, DPM;  Location: [facility] OR;  Service: Podiatry;  Laterality: Right;  with local block   LEG AMPUTATION Right 03/30/2023   AMPUTATION LEG BELOW KNEE performed by Ozell JINNY Rigg, MD at [facility] OR   TOE AMPUTATION Left 10/22/2024  AMPUTATION TOE RAY performed by Ozell DELENA Molt, DPM at [facility] OR    TOE SURGERY Left    Procedure: TOE SURGERY; x2 blisters next to big toe   YAG CAPSULOTOMY Right 12/19/2023   Dr Huey   YAG CAPSULOTOMY Left 12/27/2023   Dr Huey  [5] Family History Problem Relation Name Age of Onset   Cataracts Mother     Diabetes Father     Cataracts Father     Glaucoma Father     Diabetes Sister     Cataracts Sister     Cataracts Maternal Grandmother     Cataracts Maternal Grandfather     Diabetes Paternal Grandmother     Cataracts Paternal Grandmother     Cataracts Paternal Grandfather     Diabetes Brother     Glaucoma Brother     Breast cancer Neg Hx     Macular degeneration Neg Hx     Blindness Neg Hx     Ovarian cancer Neg Hx

## 2024-11-14 NOTE — Progress Notes (Signed)
 [facility], [place], [place]   Adult Endocrinology Clinic Note   SUBJECTIVE:  This is a 58 y.o. female who presents for follow up for diabetes mellitus type 2  LV 07/2024  Patient was consented for utilization of ambient recorder DAX Copilot tool for preparation of note for today's encounter. Note reviewed and edited by provider before finalizing.   Interval History 11/14/2024  History of Present Illness The patient is a 58 year old female who presents today for a follow-up for type 2 diabetes. She was last seen in 07/2024. At that visit, Toujeo was reduced to 32 to 34 units, NovoLog  was reduced to 10 units with meals, and it was decided that if further lows occurred, the dosage would be reduced to 5 and 5. Metformin 1000 mg twice daily was continued, and Mounjaro was increased to 10 mg weekly. She is accompanied by her family.  She has been managing her diabetes with Toujeo, adjusting the dosage between 42 and 44 units depending on her blood sugar levels. For instance, she administers 42 units when her morning blood sugar is around 160. She also takes NovoLog , typically 18 units, but reduces it to 16 units when her blood sugar drops to between 60 and 80. She is on metformin twice daily, once in the morning and once at night. She reports no side effects such as nausea, vomiting, or abdominal pain from Mounjaro. However, she does experience constipation, which she attributes to her antibiotic use. She reports no diarrhea. She has not yet discussed the possibility of using an OmniPod pump. She had to discontinue Jardiance due to a yeast infection.  She underwent an amputation and is currently in the recovery phase. She had a follow-up appointment with Dr. Like three days ago, during which the dressing was changed and she was advised to change it every other day. She cleans the area with Betadine. The wound is healing well, with some scabbing on the lateral side. She has been instructed to  avoid bearing weight on the affected limb, but she occasionally forgets. She uses an mining engineer wheelchair at home for mobility and does not remain bedridden. She has a bedside commode for convenience.  PAST SURGICAL HISTORY: Amputation  Diabetes History: Diagnosed in over 25 years ago.  Started on oral agents at that time.  Has had uncontrolled diabetes.  Has severe neuropathy due to Charcot foot.  Has family history of diabetes in father uncles and siblings.  A1c: Lab Results  Component Value Date   HGBA1C 8.2 (H) 10/21/2024   POCA1C 7.8 (A) 05/30/2024   Current Medication Regimen:  Toujeo 42-44 units (up titration instructions to 60) -- injecting 44 if above 200 mg/dL NovoLog  16-18 units twice daily prescribed   Metformin 1000 mg twice daily  Mounjaro 10 mg weekly, no side effects   Adherence: Excellent Previous therapies: Jardiance 25 mg daily, having yeast infections   Blood Glucose Monitoring: Dexcom CGM scanned into media file, greater than 3 days of continuous data  CGM interpretation: Results Continuous Glucose Monitoring (CGM): - CGM System: Dexcom - Time in Range: 48% - Time Above Range (TAR): 43% - Time Below Range (TBR): 0% - Mean glucose: 187 mg/dL - Glucose variability: Variable glycemic trends with prandial peaks - Other: GMI 7.8% She is utilizing a continuous glucose monitor (CGM). Her Dexcom CGM data from 10/31/2024 to 11/13/2024 shows an average blood glucose of 187, GMI of 7.8%, with 48% in the target range, 43% high, 9% very high, and no low or very  low readings. The CGM tracings demonstrate variable glycemic trends with prandial peaks that rise above target, particularly with the midday meal and sometimes with the evening meal, before correcting back to the target range. Adjustment to her medication regimen is indicated to improve this pattern.  Hypoglycemia: 0%  Weight:  Wt Readings from Last 3 Encounters:  10/28/24 104 kg (229 lb 6.4 oz)  10/28/24 102 kg  (225 lb 15.5 oz)  10/14/24 105 kg (232 lb)   Meal Plan: Patient eats 3 meals per day. Tries to eat carb consistent.  Exercise: Limited due to recovering from sugery.   Review of Systems:  Pertinent positives noted in the HPI  PMH, PSH, Social Hx, Family Hx, Meds and Allergies  I have independently reviewed the patient's past medical history, past surgical history, medication list, and social history noted below.   Social History   Tobacco Use   Smoking status: Never   Smokeless tobacco: Never  Vaping Use   Vaping Use: Never used  Substance Use Topics   Alcohol use: No   Drug use: No    Medications: Current Outpatient Medications  Medication Instructions   acetaminophen  (TYLENOL ) 500 mg, oral, Every 6 hours PRN   ascorbic acid (VITAMIN C) 500 mg, oral, 2 times daily   aspirin 81 mg, Daily   [Paused] atorvastatin (LIPITOR) 40 mg, oral, Daily   Baqsimi 3 mg, nasal, Once as needed   BD Nano 2nd Gen Pen Needle 32 gauge x 5/32 ndle USE TO INJECT THREE TIMES DAILY AS DIRECTED   biotin (Hair, Skin and Nails, biotin,) 10,000 mcg chew 1 Dose, Daily   blood-glucose meter misc 2 Devices, miscellaneous, 2 times daily   blood-glucose sensor (Dexcom G7 Sensor) INJECT 1 SENSOR INTO THE SKIN EVERY 10 DAYS FOR CONTINUOUS GLUCOSE MONITORING   calcium carbonate-vitamin D3 500 mg (200 mg Ca)-5 mcg (200 units Vit D) per tablet 1 tablet, Every morning   cefTRIAXone (ROCEPHIN) 2 g, intravenous, Every 24 hours, Infuse as IV push over 3 to 5 minutes (adults only) or over 30 minute infusion per pharmacy protocol.  End date 12/03/24. Medication Provided by [facility] [phone]). Ancillary medication orders: Normal saline 0.9% flush 10ml before and after medication; Heparin 10unit/ml flush 3-5ml after saline to unused lumen as directed; Epinephrine 0.3mg  (0.3ml) IM PRN anaphylaxis; Diphenhydramine 25-50mg  (0.5-69ml) IM PRN anaphylaxis.   cyanocobalamin (VITAMIN B12)  1,000 mcg, Daily   DAPTOmycin (CUBICIN) 500 mg, intravenous, Every 24 hours, Infuse as IV push over 2-5 minutes (adults only) or over 30 minutes per pharmacy protocol.  End date 12/03/24 Medication Provided by [facility] [phone]). Ancillary medication orders: Normal saline 0.9% flush 10ml before and after medication; Heparin 10unit/ml flush 3-5ml after saline to unused lumen as directed; Epinephrine 0.3mg  (0.37ml) IM PRN anaphylaxis; Diphenhydramine 25-50mg  (0.5-8ml) IM PRN anaphylaxis.   escitalopram (LEXAPRO) 10 mg, oral, Daily   ferrous sulfate (FEROSUL) 325 mg, oral, Daily   fluticasone propionate (FLONASE) 50 mcg/spray nasal spray 2 sprays, Each Nostril, Daily   gabapentin (NEURONTIN) 100 mg, oral, Nightly   insulin  aspart U-100 (NovoLOG  FlexPen) 100 unit/mL (3 mL) pen Inject 16-18 units with meals plus corrective scale. TDD 100   insulin  glargine U-300 (Toujeo Max U-300 SoloStar) 300 unit/mL (3 mL) pen pen Inject 44 units once daily. MAX dose 50 units   insulin  syringe-needle U-100 1 mL 31 gauge x 15/64 syrg Use to inject insulin  up to 4x a day   loratadine (CLARITIN) 10 mg, Daily  losartan (COZAAR) 50 mg, oral, Daily   metFORMIN (GLUCOPHAGE) 1,000 mg, oral, 2 times daily with meals   Mounjaro 12.5 mg, subcutaneous, Weekly   multivitamin (THERAGRAN) tab tablet 1 tablet, Daily   polyethylene glycol (GLYCOLAX) 17 g, Daily   Allergies: Allergies  Allergen Reactions   Ciprofloxacin Rash   Objective Data   BP 126/69 (BP Location: Left arm, Patient Position: Sitting)   Pulse 92   SpO2 96%   Vital signs reviewed.  General: WDWN, NAD.  Eyes: No lid droop or conjunctival injection.  ENT: No appreciable hearing impairment. Nodysphonia.  Neck: Symmetric. No gross thyromegaly.  Respiratory: Normal respiratory effort. No audible wheezing. CTAB CV: Symmetric chest wall excursion with respirations. RRR Abdomen: Non-distended.  MSK: No anatomic defects  noted.  Skin: Skin appears dry. No rashes.  Neuro: A&O x3. No tremor.  Psych: Pleasant, conversive.    Pertinent Labs: A1c Lab Results  Component Value Date   HGBA1C 8.2 (H) 10/21/2024   POCA1C 7.8 (A) 05/30/2024   LIPIDS Lab Results  Component Value Date   CHOL 109 08/23/2023   CHOL 108 02/22/2022   CHOL 92 01/27/2021   Lab Results  Component Value Date   HDL 47 (L) 08/23/2023   HDL 44 02/22/2022   HDL 37 01/27/2021   Lab Results  Component Value Date   LDLCALC 41 08/23/2023   Lab Results  Component Value Date   TRIG 126 08/23/2023   TRIG 145 02/22/2022   TRIG 107 01/27/2021   No results found for: CHOLHDL  MICROALBUMIN <7 in 03/2023  BMP Lab Results  Component Value Date   GLUCOSE 156 (H) 11/12/2024   CALCIUM 8.9 11/12/2024   NA 136 11/12/2024   K 4.8 11/12/2024   CO2 29 11/12/2024   CL 99 11/12/2024   BUN 12 11/12/2024   CREATININE 0.56 (L) 11/12/2024   Assessment  Madeline Chang is a 58 y.o. female with a past medical history of above who presents for:   1. Type 2 diabetes mellitus with diabetic polyneuropathy, with long-term current use of insulin     (CMD)  POC Glucose (Hemocue/NOVA)   metFORMIN (GLUCOPHAGE) 1,000 mg tablet   insulin  glargine U-300 (Toujeo Max U-300 SoloStar) 300 unit/mL (3 mL) pen pen   insulin  aspart U-100 (NovoLOG  FlexPen) 100 unit/mL (3 mL) pen   blood-glucose sensor (Dexcom G7 Sensor)   tirzepatide (Mounjaro) 12.5 mg/0.5 mL subcutaneous pen injector   glucagon (Baqsimi) 3 mg/actuation nasal spray    2. Essential (primary) hypertension      3. Hyperlipidemia, unspecified hyperlipidemia type       Plan   1) Type 2 Diabetes, uncontrolled, complicated by neuropathy - Current hemoglobin A1c goal is 7.5% or less without recurrent hypoglycemia.   - Dexcom readings indicate a slight improvement but remain higher than desired. Previous reduction in insulin  was due to satisfactory numbers. Fluctuations can occur due to  factors such as infection, antibiotic use, decreased physical activity, and changes in mobility. A1c levels have improved over the past two weeks compared to the previous three months. The goal is to reduce A1c to below 7 to enhance healing and recovery. - Average blood glucose is 187. GMI 7.8%. In target range 48%. High 43%, very high 9%. Low 0%, very low 0%. Last A1c in November was 8.2%. - Toujeo 42-44 units. NovoLog  16-18 units. Metformin 1000 mg twice daily. Mounjaro 10 mg weekly with constipation as a side effect. - Increase Mounjaro to 12.5 mg weekly. Consider OmniPod  pump to adjust blood sugar levels between meals and reduce variability. Continue current insulin  regimen as numbers are above target. If numbers improve by the next visit, a reduction in insulin  may be considered. Contact the office for dose adjustments between visits, and a video visit can be arranged.  She will notify me in between visits if she has any hypo or hyperglycemia on the current regimen.  BG monitoring: Continue Dexcom CGM G7.  Sent to pharmacy.  Patient is injecting insulin  at least 3 times a day  Labs: Labs up-to-date  Meds:  Continue Toujeo Max 42-44 units Continue NovoLog  16-18 units with meals  Metformin 1000 mg twice daily with meals Increase to Mounjaro 12.5 mg weekly  Notify office if she wants to proceed with Omnipod   Health Maintenance/Complications: Microvascular Complications:  Retinopathy: Last eye exam 2022 - due in 08/2022  Nephropathy: Last urine microalbumin - negative  Neuropathy: Yes, due to [place] foot - sees podiatrist  Macrovascular Complications: Denies hx MI, CVA, PAD  Gastroparesis: No   2. HTN - ACEinh/ARB: Losartan 50 mg, BP at goal continue ARB BP Readings from Last 3 Encounters:  11/14/24 126/69  11/12/24 124/72  11/05/24 (!) 126/53   3. HLD - Statin: Lipitor 40 mg daily.  Last LDL at goal, continue statin therapy Lab Results  Component Value Date   LDLCALC 41  08/23/2023   Return in about 3 months (around 02/12/2025) for DMT2, CGM-Dexcom.  This document serves as a record of services personally performed by Madeline Davidovna Goukassian, DO.  It was created on their behalf by Lauraine Lake, CMA, a trained medical scribe, and Certified Medical Assistant (CMA). During the course of documenting the history, physical exam and medical decision making, I was functioning as a stage manager. The creation of this record is the providers dictation and/or activities during the visit.  Electronically signed by Lauraine Lake, CMA 11/14/2024 3:11 PM    I agree the documentation is accurate and complete.  Electronically signed by: Madeline D Goukassian, DO 11/14/2024 4:05 PM

## 2024-11-20 NOTE — Telephone Encounter (Signed)
 Can be collected with next weeks blood work

## 2024-11-20 NOTE — Telephone Encounter (Signed)
 Call placed to [facility], spoke with Marolyn, informed that provider states that the missing labs can be collected next week. She appreciates the call.

## 2024-11-20 NOTE — Telephone Encounter (Signed)
 Results available for review. Missing CBC w diff and ESR. Called [facility] and spoke to [place]. Labs were canceled because tube was expired.   Please advise if nurse visit to recollect labs is needed. Last results are from 12/2, and EOT is 12/23.

## 2024-11-23 ENCOUNTER — Emergency Department (HOSPITAL_BASED_OUTPATIENT_CLINIC_OR_DEPARTMENT_OTHER): Payer: MEDICAID

## 2024-11-23 ENCOUNTER — Other Ambulatory Visit: Payer: Self-pay

## 2024-11-23 ENCOUNTER — Emergency Department (HOSPITAL_BASED_OUTPATIENT_CLINIC_OR_DEPARTMENT_OTHER)
Admission: EM | Admit: 2024-11-23 | Discharge: 2024-11-23 | Disposition: A | Discharge status: Home / Self Care | Payer: MEDICAID | Attending: Emergency Medicine | Admitting: Emergency Medicine

## 2024-11-23 ENCOUNTER — Encounter (HOSPITAL_BASED_OUTPATIENT_CLINIC_OR_DEPARTMENT_OTHER): Payer: Self-pay | Admitting: Emergency Medicine

## 2024-11-23 DIAGNOSIS — Z7982 Long term (current) use of aspirin: Secondary | ICD-10-CM | POA: Insufficient documentation

## 2024-11-23 DIAGNOSIS — I82621 Acute embolism and thrombosis of deep veins of right upper extremity: Secondary | ICD-10-CM | POA: Insufficient documentation

## 2024-11-23 DIAGNOSIS — Z7901 Long term (current) use of anticoagulants: Secondary | ICD-10-CM | POA: Insufficient documentation

## 2024-11-23 MED ORDER — APIXABAN 2.5 MG PO TABS: 5.0000 mg | ORAL_TABLET | Freq: Two times a day (BID) | ORAL | Status: DC

## 2024-11-23 MED ORDER — APIXABAN (ELIQUIS) VTE STARTER PACK (10MG AND 5MG)
ORAL_TABLET | ORAL | 0 refills | Status: AC
  Filled 2024-11-23: qty 74, 30d supply, fill #0

## 2024-11-23 MED ORDER — APIXABAN 2.5 MG PO TABS
10.0000 mg | ORAL_TABLET | Freq: Two times a day (BID) | ORAL | Status: DC
  Administered 2024-11-23: 10 mg via ORAL
  Filled 2024-11-23: qty 4

## 2024-11-23 MED ORDER — APIXABAN 5 MG PO TABS: ORAL_TABLET | ORAL | 0 refills | Status: AC

## 2024-11-23 NOTE — ED Notes (Signed)
 Flushed both ports on patient's PICC with Saline.  Both flushed easily with no resistance.

## 2024-11-23 NOTE — Discharge Instructions (Addendum)
 Your vascular ultrasound showed that you have non-occlusive thrombus in the right subclavian, right axillary, and right basilic veins.  This means you have developed a blood clot in your right upper arm.  This may be a complication of having a PICC line in place and also your recent surgery.  It is recommended that you start blood thinning medicine called anticoagulation.  We started you on Eliquis .  We gave you your first dose in the ER.  We also gave you an additional 1 day supply to have at home for tomorrow, before you can get to the pharmacy on Monday.  You will take 10 mg of Eliquis  in the morning, and 10 mg in the evening, which is 12 hours later. You will stop taking aspirin, and he will avoid any NSAID medications, such as Advil, Aleve, Ibuprofen.  Tylenol  is safe to take for pain. You will also continue your heparin flushes and your regular antibiotic infusions.  Pick up your additional prescription from the pharmacy and begin taking it as directed on Monday.  At your request, I did send a starter kit prescription to our local pharmacy at [facility], which can often provide a 30-day starter kit supply.  I also sent a 30-day supply to your preferred [facility].  If you are able to fill this on your own, you are welcome to use your own pharmacy.  You should also reach out to your primary care doctor or the doctor managing your PICC line and antibiotics, to let them know about this blood clot complication.

## 2024-11-23 NOTE — ED Provider Notes (Signed)
 [facility] Provider Note   CSN: [id number] Arrival date & time: 11/23/24  2002     Patient presents with: Vascular Access Problem   Madeline Chang is a 58 y.o. female with history of osteomyelitis and recent amputation of the left distal foot, presenting to ED with complaint of pain near her right PICC line insertion. She has had a PICC line for 2 weeks. She is scheduled to have IV antibiotics for a total of 4 weeks.  She said the nurse changed her dressing on her PICC line yesterday she began having pain near the site.  Denies history of DVT or PE.  Does not report any chest pain.  She is on 81 mg aspirin daily for general health but no other blood thinners   HPI     Prior to Admission medications  Medication Sig Start Date End Date Taking? Authorizing Provider  apixaban  (ELIQUIS ) 5 MG TABS tablet Take 2 tablets (10 mg total) by mouth 2 (two) times daily for 7 days, THEN 1 tablet (5 mg total) 2 (two) times daily for 23 days. 11/25/24 12/25/24 Yes Daissy Yerian, Donnice PARAS, MD  APIXABAN  (ELIQUIS ) VTE STARTER PACK (10MG  AND 5MG ) Take as directed on package: start with two-5mg  tablets twice daily for 7 days. On day 8, switch to one-5mg  tablet twice daily. 11/25/24  Yes Cottie Donnice PARAS, MD  atorvastatin (LIPITOR) 20 MG tablet Take 20 mg by mouth every morning. 05/09/17   [provider]  empagliflozin (JARDIANCE) 25 MG TABS tablet Take 25 mg by mouth daily.    [provider]  escitalopram (LEXAPRO) 10 MG tablet Take 10 mg by mouth daily.    [provider]  gabapentin (NEURONTIN) 100 MG capsule Take 100 mg by mouth 3 (three) times daily.    [provider]  insulin  aspart (NOVOLOG  FLEXPEN) 100 UNIT/ML FlexPen Inject 7-10 Units into the skin 3 (three) times daily with meals. Inject 10 units with breakfast, 7 units with dinner    [provider]  insulin  glargine, 2 Unit Dial, (TOUJEO MAX SOLOSTAR) 300 UNIT/ML  Solostar Pen Inject 60 Units into the skin in the morning.    [provider]  losartan (COZAAR) 50 MG tablet Take 50 mg by mouth daily. 06/06/17   [provider]  Multiple Vitamins-Minerals (CENTRUM PO) Take 1 tablet by mouth daily.    [provider]  sitaGLIPtin (JANUVIA) 25 MG tablet Take 25 mg by mouth daily.    [provider]  terconazole  (TERAZOL 7 ) 0.4 % vaginal cream Apply twice daily to vulvovaginal area, can reapply after every void, use for 7 days as needed 10/14/22   Joesph Shaver Scales, PA-C  tirzepatide (MOUNJARO) 2.5 MG/0.5ML Pen Inject 2.5 mg into the skin once a week.    [provider]    Allergies: Ciprofloxacin    Review of Systems  Updated Vital Signs BP 121/72 (BP Location: Left Arm)   Pulse 81   Temp 98.9 F (37.2 C) (Oral)   Resp 17   Ht 5' 3 (1.6 m)   Wt 104.3 kg   SpO2 97%   BMI 40.74 kg/m   Physical Exam Constitutional:      General: She is not in acute distress.    Appearance: She is obese.  HENT:     Head: Normocephalic and atraumatic.  Eyes:     Conjunctiva/sclera: Conjunctivae normal.     Pupils: Pupils are equal, round, and reactive to light.  Cardiovascular:     Rate and Rhythm: Normal rate and regular rhythm.  Pulmonary:     Effort: Pulmonary effort is normal. No respiratory distress.  Abdominal:     General: There is no distension.     Tenderness: There is no abdominal tenderness.  Musculoskeletal:     Comments: Tenderness of the right proximal arm above the PICC line insertion, no surrounding erythema or drainage  Skin:    General: Skin is warm and dry.  Neurological:     General: No focal deficit present.     Mental Status: She is alert. Mental status is at baseline.  Psychiatric:        Mood and Affect: Mood normal.        Behavior: Behavior normal.     (all labs ordered are listed, but only abnormal results are displayed) Labs Reviewed - No data to  display  EKG: None  Radiology: US  Venous Img Upper Uni Right(DVT) Result Date: 11/23/2024 EXAM: US  Duplex right Upper Extremity Veins. TECHNIQUE: Real-time ultrasound scan of the veins of the right upper extremity with color Doppler flow, spectral waveform analysis and compression. COMPARISON: None available. CLINICAL HISTORY: Arm swelling. FINDINGS: SUPERFICIAL VEINS: The cephalic vein is compressible and demonstrates normal color Doppler flow. Non-occlusive thrombus is present in the right basilic vein. DEEP VEINS: The internal jugular and brachial veins are compressible and demonstrate normal color Doppler flow. Non-occlusive thrombus is present in the right subclavian and right axillary veins. SOFT TISSUES: No acute finding. IMPRESSION: 1. Non-occlusive thrombus in the right subclavian, right axillary, and right basilic veins. Electronically signed by: Pinkie Pebbles MD 11/23/2024 08:54 PM EST RP Workstation: [id number]     Procedures   Medications Ordered in the ED  apixaban  (ELIQUIS ) tablet 10 mg (10 mg Oral Given 11/23/24 2213)    Followed by  apixaban  (ELIQUIS ) tablet 5 mg (has no administration in time range)                                    Medical Decision Making Risk Prescription drug management.   Patient is here with right arm pain.  Differential would include infection versus DVT versus other.  Vascular ultrasound concerning for acute nonocclusive thrombus.  Patient will be initiated on anticoagulation.  PICC line will be maintained in place at this time so she can continue her infusions, but she will need to closely follow-up with her doctor at [facility] on Monday.  No evidence at this time of acute PE.  No indication for thoracic imaging.  No indication for additional blood testing, doubt ongoing sepsis or bacteremia.  Picc line flushed in all ports and functional.  Cr 0.56 on check 4 days ago - normal renal function, okay for full dose eliquis   anticoagulation  Patient instructed to STOP aspirin and avoid any NSAID medications.  Of note, the patient and her family administer her antibiotics at home and they have also been giving and using heparin flushes as instructed.  However, explained at this heparin is only for use as IV flush, and is not therapeutic for her general A/C.  Patient will be provided additional 1 day supply eliquis  here per discussion and request given our clinical pharmacy is closed tomorrow; she'll pick up the prescription for eliquis  on Monday.  Her daughter by phone also requested a prescription be sent to her outpatient pharmacy of choice - if they are able to afford  it, they would rather pick it up there.     Final diagnoses:  Acute deep vein thrombosis (DVT) of right upper extremity, unspecified vein [facility])    ED Discharge Orders          Ordered    APIXABAN  (ELIQUIS ) VTE STARTER PACK (10MG  AND 5MG )       Note to Pharmacy: If starter pack unavailable, substitute with seventy-four 5 mg apixaban  tabs following the above SIG directions.   11/23/24 2235    apixaban  (ELIQUIS ) 5 MG TABS tablet  BID        11/23/24 2236               Cottie Donnice PARAS, MD 11/23/24 2325

## 2024-11-23 NOTE — ED Triage Notes (Signed)
 Pt reports pain to PICC line in RUE x 2, some redness per pt

## 2024-11-25 ENCOUNTER — Other Ambulatory Visit (HOSPITAL_BASED_OUTPATIENT_CLINIC_OR_DEPARTMENT_OTHER): Payer: Self-pay

## 2024-11-26 ENCOUNTER — Other Ambulatory Visit: Payer: Self-pay

## 2024-11-26 ENCOUNTER — Encounter (HOSPITAL_BASED_OUTPATIENT_CLINIC_OR_DEPARTMENT_OTHER): Payer: Self-pay

## 2024-11-26 DIAGNOSIS — Z5321 Procedure and treatment not carried out due to patient leaving prior to being seen by health care provider: Secondary | ICD-10-CM | POA: Diagnosis not present

## 2024-11-26 DIAGNOSIS — Z452 Encounter for adjustment and management of vascular access device: Secondary | ICD-10-CM | POA: Diagnosis present

## 2024-11-26 NOTE — ED Triage Notes (Signed)
 Pt sent here by [facility] nurse due to not being able to get PICC line cap off. Pt requesting a new cap be placed.

## 2024-11-27 ENCOUNTER — Emergency Department (HOSPITAL_BASED_OUTPATIENT_CLINIC_OR_DEPARTMENT_OTHER)
Admission: EM | Admit: 2024-11-27 | Discharge: 2024-11-27 | Payer: MEDICAID | Attending: Emergency Medicine | Admitting: Emergency Medicine
